# Patient Record
Sex: Female | Born: 1955 | Race: White | Hispanic: No | Marital: Married | Smoking: Current every day smoker
Health system: Southern US, Community
[De-identification: ages and names within clinical notes are randomized; demographics above are authoritative.]

## PROBLEM LIST (undated history)

## (undated) DIAGNOSIS — R011 Cardiac murmur, unspecified: Secondary | ICD-10-CM

## (undated) DIAGNOSIS — M1990 Unspecified osteoarthritis, unspecified site: Secondary | ICD-10-CM

## (undated) HISTORY — DX: Cardiac murmur, unspecified: R01.1

## (undated) HISTORY — DX: Unspecified osteoarthritis, unspecified site: M19.90

## (undated) HISTORY — PX: TUBAL LIGATION: SHX77

## (undated) HISTORY — PX: FRACTURE SURGERY: SHX138

---

## 2008-06-02 ENCOUNTER — Ambulatory Visit: Payer: Self-pay | Admitting: Urology

## 2010-03-15 ENCOUNTER — Emergency Department: Payer: Self-pay | Admitting: Internal Medicine

## 2011-09-09 DIAGNOSIS — J309 Allergic rhinitis, unspecified: Secondary | ICD-10-CM | POA: Insufficient documentation

## 2012-08-09 ENCOUNTER — Emergency Department: Payer: Self-pay | Admitting: Emergency Medicine

## 2014-05-25 DIAGNOSIS — E785 Hyperlipidemia, unspecified: Secondary | ICD-10-CM | POA: Insufficient documentation

## 2014-08-04 ENCOUNTER — Ambulatory Visit: Payer: Self-pay | Admitting: Family Medicine

## 2014-08-23 ENCOUNTER — Ambulatory Visit: Payer: Self-pay | Admitting: Family Medicine

## 2015-01-27 IMAGING — US US BREAST*R* LIMITED INC AXILLA
1 series · 4 of 4 positions shown · non-contrast
Comparison: Baseline evaluation 08/04/2014

CLINICAL DATA: Patient recalled from screening for right breast
asymmetry.

EXAM:
DIGITAL DIAGNOSTIC RIGHT MAMMOGRAM WITH 3D TOMOSYNTHESIS WITH CAD
ULTRASOUND RIGHT BREAST

[Series 1: us breast*right* limited inc axilla · 0.08mm/px · 4 of 4 slices shown]
[im 1/4]
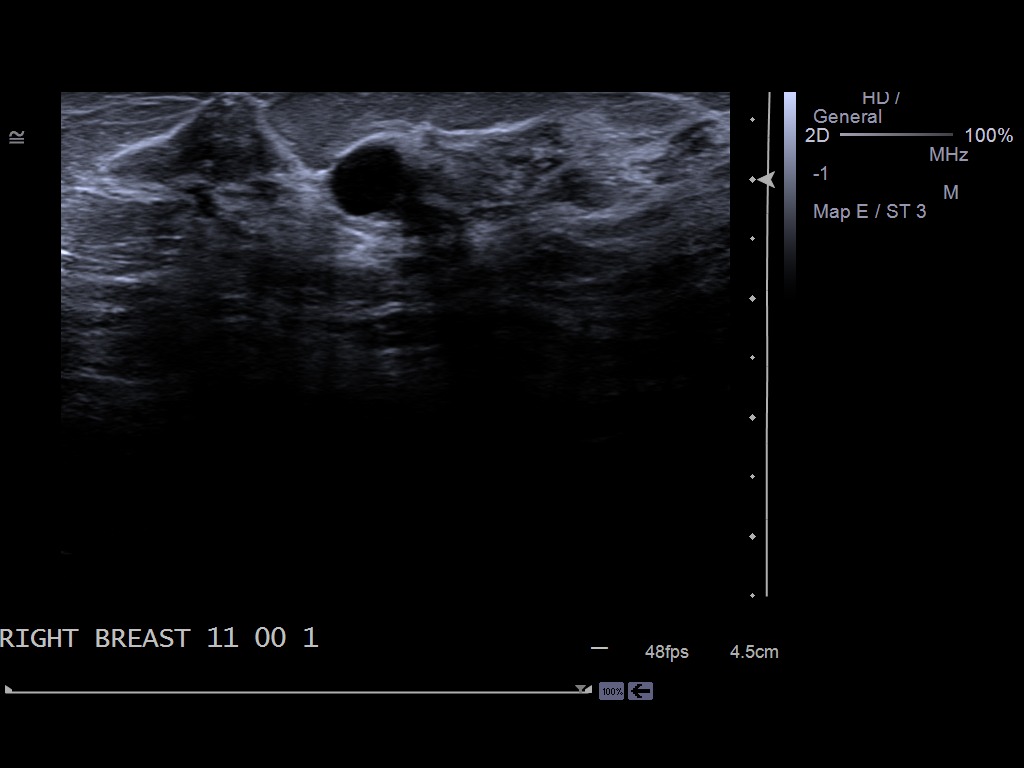
[im 2/4]
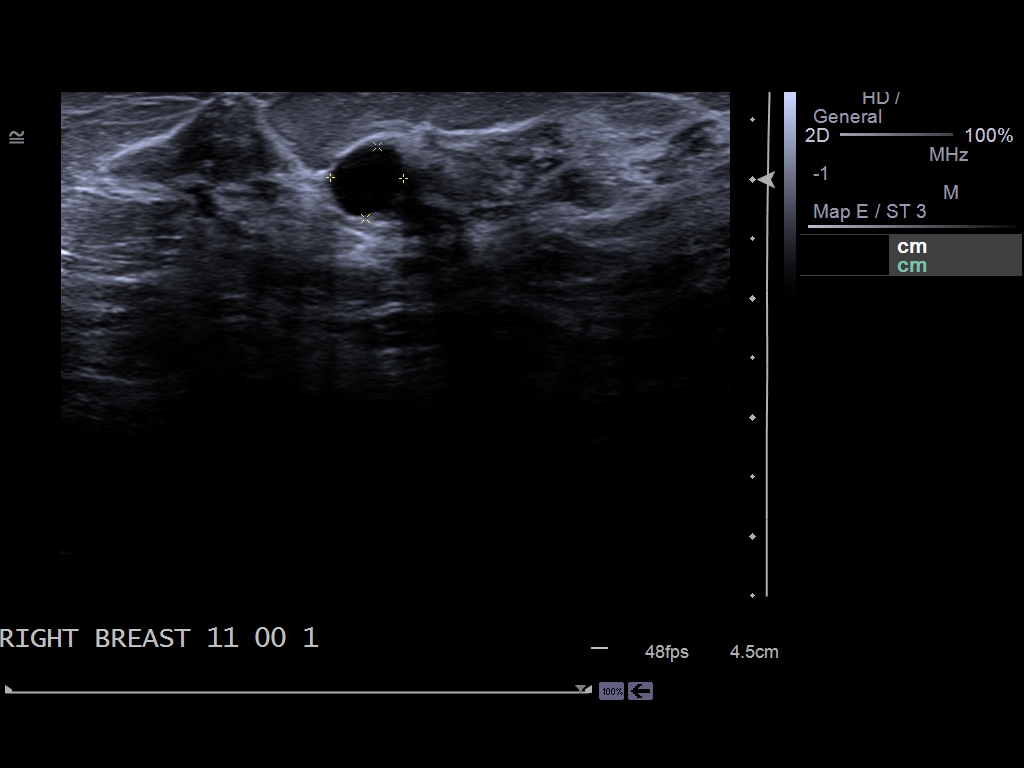
[im 3/4]
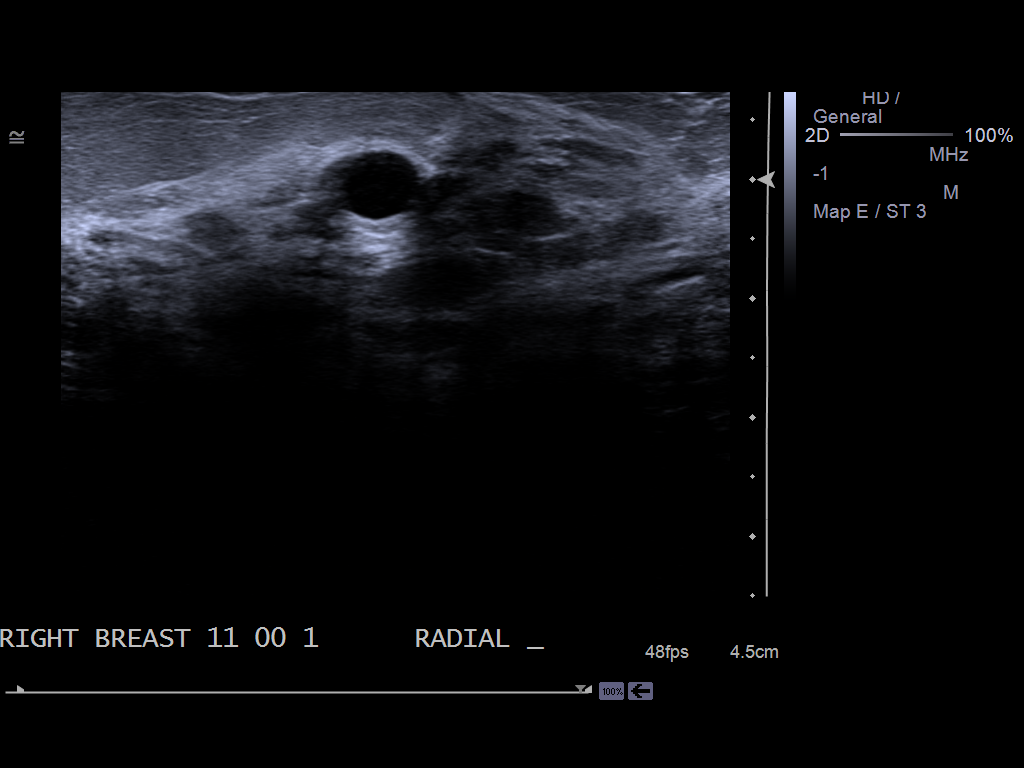
[im 4/4]
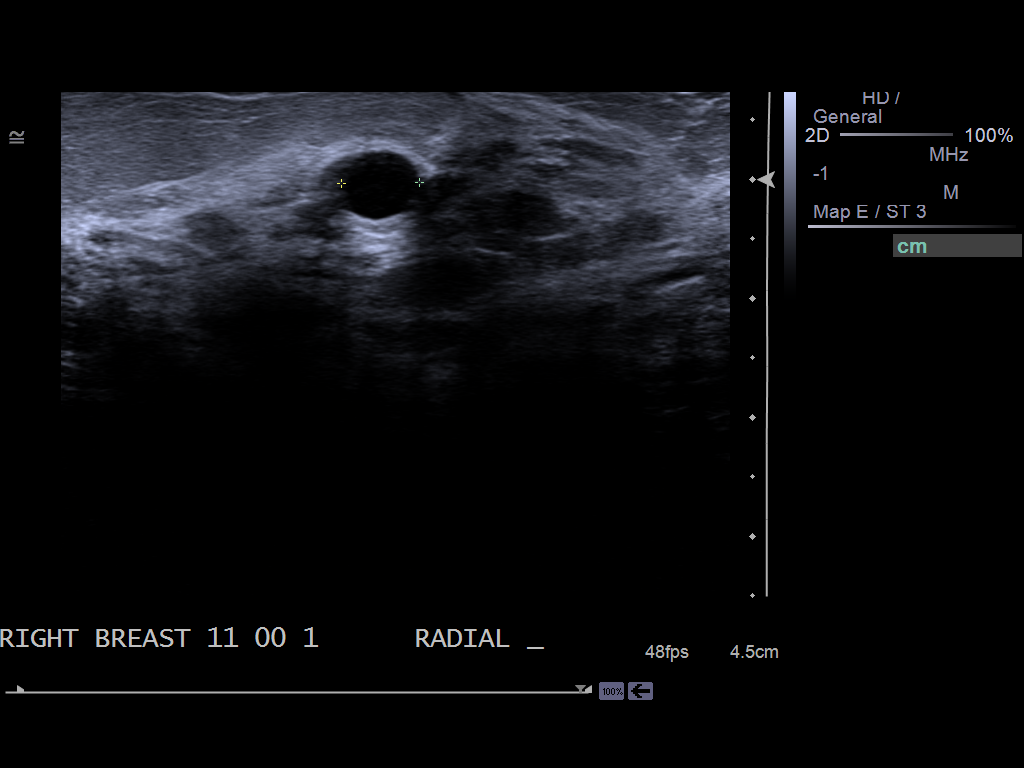

[4 of 4 positions shown; findings below may reference images not displayed]

ACR Breast Density Category c: The breast tissue is heterogeneously
dense, which may obscure small masses.
FINDINGS: Within the superior aspect of the right breast anteriorly on the MLO
view questioned asymmetry predominately effaced however there is
residual density in that location.

Mammographic images were processed with CAD.

On physical exam, I palpate no discrete mass within the superior
aspect of the right breast.

Ultrasound is performed, showing a 6 x 6 x 7 mm simple cyst within
the right breast 11 o'clock position 1 cm from the nipple.
IMPRESSION: Right breast simple cyst.

RECOMMENDATION:
Screening mammogram in one year.(Code:[id number])

I have discussed the findings and recommendations with the patient.
Results were also provided in writing at the conclusion of the
visit. If applicable, a reminder letter will be sent to the patient
regarding the next appointment.

BI-RADS CATEGORY  2: Benign.

## 2022-11-24 DIAGNOSIS — F419 Anxiety disorder, unspecified: Secondary | ICD-10-CM | POA: Insufficient documentation

## 2022-11-24 DIAGNOSIS — I10 Essential (primary) hypertension: Secondary | ICD-10-CM | POA: Insufficient documentation

## 2022-11-24 DIAGNOSIS — F32A Depression, unspecified: Secondary | ICD-10-CM | POA: Insufficient documentation

## 2022-11-24 DIAGNOSIS — R011 Cardiac murmur, unspecified: Secondary | ICD-10-CM | POA: Insufficient documentation

## 2022-11-24 NOTE — Patient Instructions (Signed)
Managing Anxiety, Adult After being diagnosed with anxiety, you may be relieved to know why you have felt or behaved a certain way. You may also feel overwhelmed about the treatment ahead and what it will mean for your life. With care and support, you can manage your anxiety. How to manage lifestyle changes Understanding the difference between stress and anxiety Although stress can play a role in anxiety, it is not the same as anxiety. Stress is your body's reaction to life changes and events, both good and bad. Stress is often caused by something external, such as a deadline, test, or competition. It normally goes away after the event has ended and will last just a few hours. But, stress can be ongoing and can lead to more than just stress. Anxiety is caused by something internal, such as imagining a terrible outcome or worrying that something will go wrong that will greatly upset you. Anxiety often does not go away even after the event is over, and it can become a long-term (chronic) worry. Lowering stress and anxiety Talk with your health care provider or a counselor to learn more about lowering anxiety and stress. They may suggest tension-reduction techniques, such as: Music. Spend time creating or listening to music that you enjoy and that inspires you. Mindfulness-based meditation. Practice being aware of your normal breaths while not trying to control your breathing. It can be done while sitting or walking. Centering prayer. Focus on a word, phrase, or sacred image that means something to you and brings you peace. Deep breathing. Expand your stomach and inhale slowly through your nose. Hold your breath for 3-5 seconds. Then breathe out slowly, letting your stomach muscles relax. Self-talk. Learn to notice and spot thought patterns that lead to anxiety reactions. Change those patterns to thoughts that feel peaceful. Muscle relaxation. Take time to tense muscles and then relax them. Choose a  tension-reduction technique that fits your lifestyle and personality. These techniques take time and practice. Set aside 5-15 minutes a day to do them. Specialized therapists can offer counseling and training in these techniques. The training to help with anxiety may be covered by some insurance plans. Other things you can do to manage stress and anxiety include: Keeping a stress diary. This can help you learn what triggers your reaction and then learn ways to manage your response. Thinking about how you react to certain situations. You may not be able to control everything, but you can control your response. Making time for activities that help you relax and not feeling guilty about spending your time in this way. Doing visual imagery. This involves imagining or creating mental pictures to help you relax. Practicing yoga. Through yoga poses, you can lower tension and relax.  Medicines Medicines for anxiety include: Antidepressant medicines. These are usually prescribed for long-term daily control. Anti-anxiety medicines. These may be added in severe cases, especially when panic attacks occur. When used together, medicines, psychotherapy, and tension-reduction techniques may be the most effective treatment. Relationships Relationships can play a big part in helping you recover. Spend more time connecting with trusted friends and family members. Think about going to couples counseling if you have a partner, taking family education classes, or going to family therapy. Therapy can help you and others better understand your anxiety. How to recognize changes in your anxiety Everyone responds differently to treatment for anxiety. Recovery from anxiety happens when symptoms lessen and stop interfering with your daily life at home or work. This may mean that you   will start to: Have better concentration and focus. Worry will interfere less in your daily thinking. Sleep better. Be less irritable. Have more  energy. Have improved memory. Try to recognize when your condition is getting worse. Contact your provider if your symptoms interfere with home or work and you feel like your condition is not improving. Follow these instructions at home: Activity Exercise. Adults should: Exercise for at least 150 minutes each week. The exercise should increase your heart rate and make you sweat (moderate-intensity exercise). Do strengthening exercises at least twice a week. Get the right amount and quality of sleep. Most adults need 7-9 hours of sleep each night. Lifestyle  Eat a healthy diet that includes plenty of vegetables, fruits, whole grains, low-fat dairy products, and lean protein. Do not eat a lot of foods that are high in fats, added sugars, or salt (sodium). Make choices that simplify your life. Do not use any products that contain nicotine or tobacco. These products include cigarettes, chewing tobacco, and vaping devices, such as e-cigarettes. If you need help quitting, ask your provider. Avoid caffeine, alcohol, and certain over-the-counter cold medicines. These may make you feel worse. Ask your pharmacist which medicines to avoid. General instructions Take over-the-counter and prescription medicines only as told by your provider. Keep all follow-up visits. This is to make sure you are managing your anxiety well or if you need more support. Where to find support You can get help and support from: Self-help groups. Online and community organizations. A trusted spiritual leader. Couples counseling. Family education classes. Family therapy. Where to find more information You may find that joining a support group helps you deal with your anxiety. The following sources can help you find counselors or support groups near you: Mental Health America: mentalhealthamerica.net Anxiety and Depression Association of America (ADAA): adaa.org National Alliance on Mental Illness (NAMI): nami.org Contact  a health care provider if: You have a hard time staying focused or finishing tasks. You spend many hours a day feeling worried about everyday life. You are very tired because you cannot stop worrying. You start to have headaches or often feel tense. You have chronic nausea or diarrhea. Get help right away if: Your heart feels like it is racing. You have shortness of breath. You have thoughts of hurting yourself or others. Get help right away if you feel like you may hurt yourself or others, or have thoughts about taking your own life. Go to your nearest emergency room or: Call 911. Call the National Suicide Prevention Lifeline at 1-800-273-8255 or 988. This is open 24 hours a day. Text the Crisis Text Line at 741741. This information is not intended to replace advice given to you by your health care provider. Make sure you discuss any questions you have with your health care provider. Document Revised: 04/23/2022 Document Reviewed: 11/05/2020 Elsevier Patient Education  2023 Elsevier Inc.  

## 2022-11-28 ENCOUNTER — Encounter: Payer: Self-pay | Admitting: Nurse Practitioner

## 2022-11-28 ENCOUNTER — Ambulatory Visit (INDEPENDENT_AMBULATORY_CARE_PROVIDER_SITE_OTHER): Payer: Medicare PPO | Admitting: Nurse Practitioner

## 2022-11-28 VITALS — BP 130/72 | HR 61 | Temp 98.1°F | Ht 63.86 in | Wt 131.4 lb

## 2022-11-28 DIAGNOSIS — I10 Essential (primary) hypertension: Secondary | ICD-10-CM

## 2022-11-28 DIAGNOSIS — F17210 Nicotine dependence, cigarettes, uncomplicated: Secondary | ICD-10-CM

## 2022-11-28 DIAGNOSIS — E785 Hyperlipidemia, unspecified: Secondary | ICD-10-CM

## 2022-11-28 DIAGNOSIS — F419 Anxiety disorder, unspecified: Secondary | ICD-10-CM | POA: Diagnosis not present

## 2022-11-28 DIAGNOSIS — R011 Cardiac murmur, unspecified: Secondary | ICD-10-CM

## 2022-11-28 DIAGNOSIS — B079 Viral wart, unspecified: Secondary | ICD-10-CM

## 2022-11-28 DIAGNOSIS — Z7689 Persons encountering health services in other specified circumstances: Secondary | ICD-10-CM

## 2022-11-28 DIAGNOSIS — F324 Major depressive disorder, single episode, in partial remission: Secondary | ICD-10-CM | POA: Diagnosis not present

## 2022-11-28 DIAGNOSIS — M1990 Unspecified osteoarthritis, unspecified site: Secondary | ICD-10-CM

## 2022-11-28 MED ORDER — SERTRALINE HCL 100 MG PO TABS: 100.0000 mg | ORAL_TABLET | Freq: Every day | ORAL | 4 refills | Status: DC

## 2022-11-28 MED ORDER — TRAZODONE HCL 50 MG PO TABS: 100.0000 mg | ORAL_TABLET | Freq: Every evening | ORAL | 4 refills | Status: DC | PRN

## 2022-11-28 NOTE — Assessment & Plan Note (Addendum)
Chronic, stable at this time with no medications.  Continue diet and exercise focus + maintaining weight loss.  Recommend she monitor BP at least a few mornings a week at home and document.  DASH diet at home.  Labs today: obtain next visit at physical.  Recommend cut back on smoking.  Return in 4 weeks.

## 2022-11-28 NOTE — Assessment & Plan Note (Signed)
Systolic 2/6 noted left upper chest, no symptoms.  Consider imaging if worsening or symptoms present.

## 2022-11-28 NOTE — Assessment & Plan Note (Signed)
Per past notes, plan on recheck lipid panel at physical in 4 weeks and determine if need for statin therapy.  At this time continue focus on exercise and diet + cut back on smoking.

## 2022-11-28 NOTE — Assessment & Plan Note (Signed)
To hands and lower back.  Recommend use of Tylenol 1000 MG every morning and Voltaren gel as needed.  If worsening consider imaging and possible physical therapy.  Currently she remains very active at baseline.

## 2022-11-28 NOTE — Progress Notes (Signed)
New Patient Office Visit  Subjective    Patient ID: Debra Munoz, female    DOB: 23-Jan-1956  Age: 67 y.o. MRN: [id number]  CC:  Chief Complaint  Patient presents with   New Patient (Initial Visit)    Just to establish care    HPI [facility] presents for new patient visit to establish care.  Introduced to Publishing rights manager role and practice setting.  All questions answered.  Discussed provider/patient relationship and expectations. Previously followed by [facility] with [facility].  Worked at [employer] as a Diplomatic Services operational officer.   Does have some arthritis to hands and lower back, takes occasional Tylenol.  Small wart to palmar aspect of left hand would like to see dermatology.  HYPERTENSION  In the past was on blood pressure medication -- but this stopped one year ago after breaking both ankles -- went to get out of chair and leg had fallen asleep, she stood up and fell.  She lost weight during that time, was almost 160 lbs to start, lost about 30 pounds.  Is a current smoker, smokes 1 pack or less a day.  Has been smoking for 50 years or more.  Did quit one time, took Chantix and quit for 3 years.   Duration of hypertension: chronic BP monitoring frequency:  not checking BP range:  Aspirin: no Recurrent headaches: no Visual changes: no Palpitations: sometimes will have speeding up sensation, but not often Dyspnea: no Chest pain: no Lower extremity edema: no Dizzy/lightheaded: no   DEPRESSION Taking Zoloft and Trazodone daily.  Dealing with some grieving as her friend, old room mate, is under hospice care for breast cancer. Started taking Zoloft when Covid started. Mood status: stable Satisfied with current treatment?: yes Symptom severity: mild  Duration of current treatment : chronic Side effects: no Medication compliance: good compliance Psychotherapy/counseling: none Previous psychiatric medications: Paxil Depressed mood: occasional Anxious mood:  occasional Anhedonia: no Significant weight loss or gain: no Insomnia: occasionally -- wakes up, falls asleep well Fatigue: no Feelings of worthlessness or guilt: no Impaired concentration/indecisiveness: no Suicidal ideations: no Hopelessness: no Crying spells:  occasional with grieving    11/28/2022    8:58 AM  Depression screen PHQ 2/9  Decreased Interest 0  Down, Depressed, Hopeless 1  PHQ - 2 Score 1  Altered sleeping 1  Tired, decreased energy 0  Change in appetite 0  Feeling bad or failure about yourself  0  Trouble concentrating 0  Moving slowly or fidgety/restless 0  Suicidal thoughts 0  PHQ-9 Score 2  Difficult doing work/chores Not difficult at all       11/28/2022    8:59 AM  GAD 7 : Generalized Anxiety Score  Nervous, Anxious, on Edge 0  Control/stop worrying 0  Worry too much - different things 0  Trouble relaxing 0  Restless 0  Easily annoyed or irritable 0  Afraid - awful might happen 0  Total GAD 7 Score 0  Anxiety Difficulty Not difficult at all     Outpatient Encounter Medications as of 11/28/2022  Medication Sig   [DISCONTINUED] PARoxetine (PAXIL) 40 MG tablet Take 40 mg by mouth every morning.   [DISCONTINUED] sertraline (ZOLOFT) 100 MG tablet Take 100 mg by mouth daily.   [DISCONTINUED] traZODone (DESYREL) 50 MG tablet Take 100 mg by mouth at bedtime as needed.   sertraline (ZOLOFT) 100 MG tablet Take 1 tablet (100 mg total) by mouth daily.   traZODone (DESYREL) 50 MG tablet Take 2 tablets (  100 mg total) by mouth at bedtime as needed.   No facility-administered encounter medications on file as of 11/28/2022.    Past Medical History:  Diagnosis Date   Arthritis    Heart murmur     Past Surgical History:  Procedure Laterality Date   FRACTURE SURGERY     TUBAL LIGATION      Family History  Problem Relation Age of Onset   Heart disease Mother    Emphysema Mother    Diabetes Mother    Diabetes Sister    Diabetes Sister    Aneurysm  Maternal Grandfather        brain   Heart disease Paternal Grandfather     Social History   Socioeconomic History   Marital status: Married    Spouse name: Not on file   Number of children: 2   Years of education: Not on file   Highest education level: Not on file  Occupational History   Occupation: retired  Tobacco Use   Smoking status: Every Day    Packs/day: 1.00    Years: 50.00    Additional pack years: 0.00    Total pack years: 50.00    Types: Cigarettes    Passive exposure: Past   Smokeless tobacco: Never  Vaping Use   Vaping Use: Never used  Substance and Sexual Activity   Alcohol use: Not Currently   Drug use: Never   Sexual activity: Not Currently  Other Topics Concern   Not on file  Social History Narrative   Not on file   Social Determinants of Health   Financial Resource Strain: Low Risk  (11/28/2022)   Overall Financial Resource Strain (CARDIA)    Difficulty of Paying Living Expenses: Not hard at all  Food Insecurity: No Food Insecurity (11/28/2022)   Hunger Vital Sign    Worried About Running Out of Food in the Last Year: Never true    Ran Out of Food in the Last Year: Never true  Transportation Needs: No Transportation Needs (11/28/2022)   PRAPARE - Administrator, Civil Service (Medical): No    Lack of Transportation (Non-Medical): No  Physical Activity: Sufficiently Active (11/28/2022)   Exercise Vital Sign    Days of Exercise per Week: 5 days    Minutes of Exercise per Session: 30 min  Stress: No Stress Concern Present (11/28/2022)   [employer] of Occupational Health - Occupational Stress Questionnaire    Feeling of Stress : Not at all  Social Connections: Moderately Integrated (11/28/2022)   Social Connection and Isolation Panel [NHANES]    Frequency of Communication with Friends and Family: More than three times a week    Frequency of Social Gatherings with Friends and Family: More than three times a week    Attends Religious  Services: 1 to 4 times per year    Active Member of [employer] or Organizations: No    Attends Banker Meetings: Never    Marital Status: Married  Catering manager Violence: Not At Risk (11/28/2022)   Humiliation, Afraid, Rape, and Kick questionnaire    Fear of Current or Ex-Partner: No    Emotionally Abused: No    Physically Abused: No    Sexually Abused: No    Review of Systems  Constitutional:  Negative for chills, diaphoresis, fever and weight loss.  Respiratory:  Negative for cough, shortness of breath and wheezing.   Cardiovascular:  Negative for chest pain, palpitations, orthopnea and leg swelling.  Neurological: Negative.  Endo/Heme/Allergies: Negative.   Psychiatric/Behavioral:  Positive for depression. Negative for suicidal ideas. The patient is nervous/anxious and has insomnia.       Objective    BP 130/72 (BP Location: Left Arm, Patient Position: Sitting)   Pulse 61   Temp 98.1 F (36.7 C) (Oral)   Ht 5' 3.86" (1.622 m)   Wt 131 lb 6.4 oz (59.6 kg)   SpO2 94%   BMI 22.66 kg/m   Physical Exam Vitals and nursing note reviewed.  Constitutional:      General: She is awake. She is not in acute distress.    Appearance: She is well-developed and well-groomed. She is not ill-appearing or toxic-appearing.  HENT:     Head: Normocephalic.     Right Ear: Hearing and external ear normal.     Left Ear: Hearing and external ear normal.  Eyes:     General: Lids are normal.        Right eye: No discharge.        Left eye: No discharge.     Conjunctiva/sclera: Conjunctivae normal.     Pupils: Pupils are equal, round, and reactive to light.  Neck:     Thyroid: No thyromegaly.     Vascular: No carotid bruit.  Cardiovascular:     Rate and Rhythm: Normal rate and regular rhythm.     Pulses:          Dorsalis pedis pulses are 1+ on the right side and 1+ on the left side.       Posterior tibial pulses are 1+ on the right side and 1+ on the left side.     Heart  sounds: Murmur heard.     Systolic murmur is present with a grade of 2/6.     No gallop.  Pulmonary:     Effort: Pulmonary effort is normal. No accessory muscle usage or respiratory distress.     Breath sounds: Normal breath sounds.  Abdominal:     General: Bowel sounds are normal.     Palpations: Abdomen is soft. There is no hepatomegaly or splenomegaly.  Musculoskeletal:     Cervical back: Normal range of motion and neck supple.     Right lower leg: No edema.     Left lower leg: No edema.     Right foot: Normal range of motion.     Left foot: Normal range of motion.  Feet:     Right foot:     Protective Sensation: 10 sites tested.  10 sites sensed.     Left foot:     Protective Sensation: 10 sites tested.  10 sites sensed.     Comments: Some varicosities/spider veins noted to bilateral lower legs. Lymphadenopathy:     Head:     Right side of head: No submental, submandibular, tonsillar, preauricular or posterior auricular adenopathy.     Left side of head: No submental, submandibular, tonsillar, preauricular or posterior auricular adenopathy.     Cervical: No cervical adenopathy.  Skin:    General: Skin is warm and dry.       Neurological:     Mental Status: She is alert and oriented to person, place, and time.     Deep Tendon Reflexes: Reflexes are normal and symmetric.     Reflex Scores:      Brachioradialis reflexes are 2+ on the right side and 2+ on the left side.      Patellar reflexes are 2+ on the right side and 2+  on the left side. Psychiatric:        Attention and Perception: Attention normal.        Mood and Affect: Mood normal.        Speech: Speech normal.        Behavior: Behavior normal. Behavior is cooperative.        Thought Content: Thought content normal.       Assessment & Plan:   Problem List Items Addressed This Visit       Cardiovascular and Mediastinum   Hypertension    Chronic, stable at this time with no medications.  Continue diet and  exercise focus + maintaining weight loss.  Recommend she monitor BP at least a few mornings a week at home and document.  DASH diet at home.  Labs today: obtain next visit at physical.  Recommend cut back on smoking.  Return in 4 weeks.         Musculoskeletal and Integument   Arthritis    To hands and lower back.  Recommend use of Tylenol 1000 MG every morning and Voltaren gel as needed.  If worsening consider imaging and possible physical therapy.  Currently she remains very active at baseline.      Wart of hand    Will place referral to dermatology for full skin exam per request.      Relevant Orders   Ambulatory referral to Dermatology     Other   Anxiety    Refer to depression plan of care.      Relevant Medications   sertraline (ZOLOFT) 100 MG tablet   traZODone (DESYREL) 50 MG tablet   Cigarette nicotine dependence without complication    I have recommended complete cessation of tobacco use. I have discussed various options available for assistance with tobacco cessation including over the counter methods (Nicotine gum, patch and lozenges). We also discussed prescription options (Chantix, Nicotine Inhaler / Nasal Spray). The patient is not interested in pursuing any prescription tobacco cessation options at this time. Referral for lung cancer screening.      Relevant Orders   Ambulatory Referral Lung Cancer Screening [facility]   Depression - Primary    Chronic, stable.  Denies SI/HI.  Continue Zoloft and Trazodone, which offer benefit to overall mood.  Refills sent in.  Consider therapy is worsening mood.        Relevant Medications   sertraline (ZOLOFT) 100 MG tablet   traZODone (DESYREL) 50 MG tablet   Dyslipidemia    Per past notes, plan on recheck lipid panel at physical in 4 weeks and determine if need for statin therapy.  At this time continue focus on exercise and diet + cut back on smoking.      Heart murmur    Systolic 2/6 noted left upper chest, no  symptoms.  Consider imaging if worsening or symptoms present.      Other Visit Diagnoses     Encounter to establish care       Introduced to clinic and provider, will attempt to get old records from [facility], release of records performed.       Return in about 4 weeks (around 12/26/2022) for Annual physical.   Marjie Skiff, NP

## 2022-11-28 NOTE — Assessment & Plan Note (Signed)
Refer to depression plan of care. 

## 2022-11-28 NOTE — Assessment & Plan Note (Signed)
Chronic, stable.  Denies SI/HI.  Continue Zoloft and Trazodone, which offer benefit to overall mood.  Refills sent in.  Consider therapy is worsening mood.

## 2022-11-28 NOTE — Assessment & Plan Note (Addendum)
I have recommended complete cessation of tobacco use. I have discussed various options available for assistance with tobacco cessation including over the counter methods (Nicotine gum, patch and lozenges). We also discussed prescription options (Chantix, Nicotine Inhaler / Nasal Spray). The patient is not interested in pursuing any prescription tobacco cessation options at this time.  Referral for lung cancer screening. 

## 2022-11-28 NOTE — Assessment & Plan Note (Signed)
Will place referral to dermatology for full skin exam per request.

## 2023-01-12 NOTE — Patient Instructions (Signed)
Please call to schedule your mammogram and/or bone density: [facility]  Address: [address], [place], [place] [address] Phone: [phone]  [facility] [address] [place],  [place]  [address] Phone: [phone]   Healthy Eating, Adult Healthy eating may help you get and keep a healthy body weight, reduce the risk of chronic disease, and live a long and productive life. It is important to follow a healthy eating pattern. Your nutritional and calorie needs should be met mainly by different nutrient-rich foods. What are tips for following this plan? Reading food labels Read labels and choose the following: Reduced or low sodium products. Juices with 100% fruit juice. Foods with low saturated fats (<3 g per serving) and high polyunsaturated and monounsaturated fats. Foods with whole grains, such as whole wheat, cracked wheat, brown rice, and wild rice. Whole grains that are fortified with folic acid. This is recommended for females who are pregnant or who want to become pregnant. Read labels and do not eat or drink the following: Foods or drinks with added sugars. These include foods that contain brown sugar, corn sweetener, corn syrup, dextrose, fructose, glucose, high-fructose corn syrup, honey, invert sugar, lactose, malt syrup, maltose, molasses, raw sugar, sucrose, trehalose, or turbinado sugar. Limit your intake of added sugars to less than 10% of your total daily calories. Do not eat more than the following amounts of added sugar per day: 6 teaspoons (25 g) for females. 9 teaspoons (38 g) for males. Foods that contain processed or refined starches and grains. Refined grain products, such as white flour, degermed cornmeal, white bread, and white rice. Shopping Choose nutrient-rich snacks, such as vegetables, whole fruits, and nuts. Avoid high-calorie and high-sugar snacks, such as potato chips,  fruit snacks, and candy. Use oil-based dressings and spreads on foods instead of solid fats such as butter, margarine, sour cream, or cream cheese. Limit pre-made sauces, mixes, and "instant" products such as flavored rice, instant noodles, and ready-made pasta. Try more plant-protein sources, such as tofu, tempeh, black beans, edamame, lentils, nuts, and seeds. Explore eating plans such as the Mediterranean diet or vegetarian diet. Try heart-healthy dips made with beans and healthy fats like hummus and guacamole. Vegetables go great with these. Cooking Use oil to saut or stir-fry foods instead of solid fats such as butter, margarine, or lard. Try baking, boiling, grilling, or broiling instead of frying. Remove the fatty part of meats before cooking. Steam vegetables in water or broth. Meal planning  At meals, imagine dividing your plate into fourths: One-half of your plate is fruits and vegetables. One-fourth of your plate is whole grains. One-fourth of your plate is protein, especially lean meats, poultry, eggs, tofu, beans, or nuts. Include low-fat dairy as part of your daily diet. Lifestyle Choose healthy options in all settings, including home, work, school, restaurants, or stores. Prepare your food safely: Wash your hands after handling raw meats. Where you prepare food, keep surfaces clean by regularly washing with hot, soapy water. Keep raw meats separate from ready-to-eat foods, such as fruits and vegetables. Cook seafood, meat, poultry, and eggs to the recommended temperature. Get a food thermometer. Store foods at safe temperatures. In general: Keep cold foods at 7F (4.4C) or below. Keep hot foods at 17F (60C) or above. Keep your freezer at [facility] (-17.8C) or below. Foods are not safe to eat if they have been between the temperatures of 40-17F (4.4-60C) for more than  2 hours. What foods should I eat? Fruits Aim to eat 1-2 cups of fresh, canned (in natural juice),  or frozen fruits each day. One cup of fruit equals 1 small apple, 1 large banana, 8 large strawberries, 1 cup (237 g) canned fruit,  cup (82 g) dried fruit, or 1 cup (240 mL) 100% juice. Vegetables Aim to eat 2-4 cups of fresh and frozen vegetables each day, including different varieties and colors. One cup of vegetables equals 1 cup (91 g) broccoli or cauliflower florets, 2 medium carrots, 2 cups (150 g) raw, leafy greens, 1 large tomato, 1 large bell pepper, 1 large sweet potato, or 1 medium white potato. Grains Aim to eat 5-10 ounce-equivalents of whole grains each day. Examples of 1 ounce-equivalent of grains include 1 slice of bread, 1 cup (40 g) ready-to-eat cereal, 3 cups (24 g) popcorn, or  cup (93 g) cooked rice. Meats and other proteins Try to eat 5-7 ounce-equivalents of protein each day. Examples of 1 ounce-equivalent of protein include 1 egg,  oz nuts (12 almonds, 24 pistachios, or 7 walnut halves), 1/4 cup (90 g) cooked beans, 6 tablespoons (90 g) hummus or 1 tablespoon (16 g) peanut butter. A cut of meat or fish that is the size of a deck of cards is about 3-4 ounce-equivalents (85 g). Of the protein you eat each week, try to have at least 8 sounce (227 g) of seafood. This is about 2 servings per week. This includes salmon, trout, herring, sardines, and anchovies. Dairy Aim to eat 3 cup-equivalents of fat-free or low-fat dairy each day. Examples of 1 cup-equivalent of dairy include 1 cup (240 mL) milk, 8 ounces (250 g) yogurt, 1 ounces (44 g) natural cheese, or 1 cup (240 mL) fortified soy milk. Fats and oils Aim for about 5 teaspoons (21 g) of fats and oils per day. Choose monounsaturated fats, such as canola and olive oils, mayonnaise made with olive oil or avocado oil, avocados, peanut butter, and most nuts, or polyunsaturated fats, such as sunflower, corn, and soybean oils, walnuts, pine nuts, sesame seeds, sunflower seeds, and flaxseed. Beverages Aim for 6 eight-ounce glasses of  water per day. Limit coffee to 3-5 eight-ounce cups per day. Limit caffeinated beverages that have added calories, such as soda and energy drinks. If you drink alcohol: Limit how much you have to: 0-1 drink a day if you are female. 0-2 drinks a day if you are female. Know how much alcohol is in your drink. In the [place] one drink is one 12 oz bottle of beer (355 mL), one 5 oz glass of wine (148 mL), or one 1 oz glass of hard liquor (44 mL). Seasoning and other foods Try not to add too much salt to your food. Try using herbs and spices instead of salt. Try not to add sugar to food. This information is based on [place] nutrition guidelines. To learn more, visit DisposableNylon.be. Exact amounts may vary. You may need different amounts. This information is not intended to replace advice given to you by your health care provider. Make sure you discuss any questions you have with your health care provider. Document Revised: 04/15/2022 Document Reviewed: 04/15/2022 Elsevier Patient Education  2024 [employer].

## 2023-01-16 ENCOUNTER — Ambulatory Visit (INDEPENDENT_AMBULATORY_CARE_PROVIDER_SITE_OTHER): Payer: Medicare PPO | Admitting: Nurse Practitioner

## 2023-01-16 ENCOUNTER — Encounter: Payer: Self-pay | Admitting: Nurse Practitioner

## 2023-01-16 VITALS — BP 148/86 | HR 62 | Temp 97.9°F | Ht 63.86 in | Wt 133.4 lb

## 2023-01-16 DIAGNOSIS — I10 Essential (primary) hypertension: Secondary | ICD-10-CM | POA: Diagnosis not present

## 2023-01-16 DIAGNOSIS — F17210 Nicotine dependence, cigarettes, uncomplicated: Secondary | ICD-10-CM

## 2023-01-16 DIAGNOSIS — F419 Anxiety disorder, unspecified: Secondary | ICD-10-CM | POA: Diagnosis not present

## 2023-01-16 DIAGNOSIS — F32A Depression, unspecified: Secondary | ICD-10-CM

## 2023-01-16 DIAGNOSIS — Z0000 Encounter for general adult medical examination without abnormal findings: Secondary | ICD-10-CM | POA: Diagnosis not present

## 2023-01-16 DIAGNOSIS — F324 Major depressive disorder, single episode, in partial remission: Secondary | ICD-10-CM

## 2023-01-16 DIAGNOSIS — E559 Vitamin D deficiency, unspecified: Secondary | ICD-10-CM

## 2023-01-16 DIAGNOSIS — R011 Cardiac murmur, unspecified: Secondary | ICD-10-CM

## 2023-01-16 DIAGNOSIS — E785 Hyperlipidemia, unspecified: Secondary | ICD-10-CM | POA: Diagnosis not present

## 2023-01-16 DIAGNOSIS — Z1231 Encounter for screening mammogram for malignant neoplasm of breast: Secondary | ICD-10-CM

## 2023-01-16 DIAGNOSIS — Z23 Encounter for immunization: Secondary | ICD-10-CM

## 2023-01-16 MED ORDER — VALSARTAN 40 MG PO TABS
40.0000 mg | ORAL_TABLET | Freq: Every day | ORAL | 3 refills | Status: DC
Start: 2023-01-16 — End: 2023-02-13

## 2023-01-16 NOTE — Assessment & Plan Note (Signed)
Chronic, stable.  Denies SI/HI.  Continue Zoloft and Trazodone, which offer benefit to overall mood.  Refills sent in.  Consider therapy is worsening mood.   

## 2023-01-16 NOTE — Assessment & Plan Note (Signed)
Systolic 2/6 noted left upper chest, no symptoms.  Consider imaging if worsening or symptoms present. 

## 2023-01-16 NOTE — Assessment & Plan Note (Signed)
Per past notes, plan on recheck lipid panel today and determine if need for statin therapy.  At this time continue focus on exercise and diet + cut back on smoking.

## 2023-01-16 NOTE — Assessment & Plan Note (Signed)
Chronic,ongoing.  BP elevated on checks today and elevated at home with some headaches.  Discussed at length with patient and joint decision to restart medication.  Will start with Valsartan 40 MG daily, educated her on the ARB family and side effects.  Continue diet and exercise focus + maintaining weight loss.  Recommend she monitor BP at least a few mornings a week at home and document.  DASH diet at home.  Labs today: CBC, CMP, TSH.  Recommend cut back on smoking.  Return in 4 weeks.

## 2023-01-16 NOTE — Assessment & Plan Note (Signed)
Refer to depression plan of care. 

## 2023-01-16 NOTE — Progress Notes (Addendum)
BP (!) 148/86 (BP Location: Left Arm, Patient Position: Sitting, Cuff Size: Normal)   Pulse 62   Temp 97.9 F (36.6 C) (Oral)   Ht 5' 3.86" (1.622 m)   Wt 133 lb 6.4 oz (60.5 kg)   SpO2 98%   BMI 23.00 kg/m    Subjective:    Patient ID: Debra Munoz, female    DOB: 29-Apr-1956, 67 y.o.   MRN: [id number]  HPI: Debra Munoz is a 67 y.o. female presenting on 01/16/2023 for Medicare Annual Wellness and physical. Current medical complaints include:none  She currently lives with: husband Menopausal Symptoms: no  HYPERTENSION  In past took blood pressure medication, has been off of this for <1 year.    Is a current smoker, smokes 1 pack or less a day.  Has been smoking for 50 years or more.  Did quit one time, took Chantix and quit for 3 years.   Duration of hypertension: chronic BP monitoring frequency: daily BP range: as in office, similar Aspirin: no Recurrent headaches: no Visual changes: no Palpitations: sometimes will have speeding up sensation, but not often Dyspnea: no Chest pain: no Lower extremity edema: no Dizzy/lightheaded: no   DEPRESSION Taking Zoloft and Trazodone daily.  Grieving as her friend, old room mate, is under hospice care for breast cancer.  Mood status: stable Satisfied with current treatment?: yes Symptom severity: mild  Duration of current treatment : chronic Side effects: no Medication compliance: good compliance Psychotherapy/counseling: none Previous psychiatric medications: Paxil Depressed mood: no Anxious mood: no Anhedonia: no Significant weight loss or gain: no Insomnia: occasionally -- wakes up, falls asleep well Fatigue: no Feelings of worthlessness or guilt: no Impaired concentration/indecisiveness: no Suicidal ideations: no Hopelessness: no Crying spells:  occasional with grieving    01/16/2023    8:16 AM 11/28/2022    8:58 AM  Depression screen PHQ 2/9  Decreased Interest 0 0  Down, Depressed, Hopeless  1 1  PHQ - 2 Score 1 1  Altered sleeping 2 1  Tired, decreased energy 0 0  Change in appetite 0 0  Feeling bad or failure about yourself  0 0  Trouble concentrating 0 0  Moving slowly or fidgety/restless 0 0  Suicidal thoughts 0 0  PHQ-9 Score 3 2  Difficult doing work/chores Not difficult at all Not difficult at all       01/16/2023    8:17 AM 11/28/2022    8:59 AM  GAD 7 : Generalized Anxiety Score  Nervous, Anxious, on Edge 0 0  Control/stop worrying 0 0  Worry too much - different things 0 0  Trouble relaxing 0 0  Restless 0 0  Easily annoyed or irritable 0 0  Afraid - awful might happen 0 0  Total GAD 7 Score 0 0  Anxiety Difficulty Not difficult at all Not difficult at all        11/28/2022    8:58 AM 01/16/2023    8:16 AM  Fall Risk  Falls in the past year? 0 1  Was there an injury with Fall? 0 0  Fall Risk Category Calculator 0 1  Patient at Risk for Falls Due to No Fall Risks History of fall(s)  Fall risk Follow up Falls evaluation completed Falls evaluation completed     Functional Status Survey: Is the patient deaf or have difficulty hearing?: No Does the patient have difficulty seeing, even when wearing glasses/contacts?: No Does the patient have difficulty concentrating, remembering, or making decisions?: No  Does the patient have difficulty walking or climbing stairs?: No Does the patient have difficulty dressing or bathing?: No Does the patient have difficulty doing errands alone such as visiting a doctor's office or shopping?: No    Past Medical History:  Past Medical History:  Diagnosis Date   Arthritis    Heart murmur     Surgical History:  Past Surgical History:  Procedure Laterality Date   FRACTURE SURGERY     TUBAL LIGATION      Medications:  Current Outpatient Medications on File Prior to Visit  Medication Sig   sertraline (ZOLOFT) 100 MG tablet Take 1 tablet (100 mg total) by mouth daily.   traZODone (DESYREL) 50 MG tablet Take 2  tablets (100 mg total) by mouth at bedtime as needed.   No current facility-administered medications on file prior to visit.    Allergies:  No Known Allergies  Social History:  Social History   Socioeconomic History   Marital status: Married    Spouse name: Not on file   Number of children: 2   Years of education: Not on file   Highest education level: Not on file  Occupational History   Occupation: retired  Tobacco Use   Smoking status: Every Day    Packs/day: 1.00    Years: 50.00    Additional pack years: 0.00    Total pack years: 50.00    Types: Cigarettes    Passive exposure: Past   Smokeless tobacco: Never  Vaping Use   Vaping Use: Never used  Substance and Sexual Activity   Alcohol use: Not Currently   Drug use: Never   Sexual activity: Not Currently  Other Topics Concern   Not on file  Social History Narrative   Not on file   Social Determinants of Health   Financial Resource Strain: Low Risk  (11/28/2022)   Overall Financial Resource Strain (CARDIA)    Difficulty of Paying Living Expenses: Not hard at all  Food Insecurity: No Food Insecurity (11/28/2022)   Hunger Vital Sign    Worried About Running Out of Food in the Last Year: Never true    Ran Out of Food in the Last Year: Never true  Transportation Needs: No Transportation Needs (11/28/2022)   PRAPARE - Administrator, Civil Service (Medical): No    Lack of Transportation (Non-Medical): No  Physical Activity: Sufficiently Active (11/28/2022)   Exercise Vital Sign    Days of Exercise per Week: 5 days    Minutes of Exercise per Session: 30 min  Stress: No Stress Concern Present (11/28/2022)   [employer] of Occupational Health - Occupational Stress Questionnaire    Feeling of Stress : Not at all  Social Connections: Moderately Integrated (11/28/2022)   Social Connection and Isolation Panel [NHANES]    Frequency of Communication with Friends and Family: More than three times a week     Frequency of Social Gatherings with Friends and Family: More than three times a week    Attends Religious Services: 1 to 4 times per year    Active Member of [employer] or Organizations: No    Attends Banker Meetings: Never    Marital Status: Married  Catering manager Violence: Not At Risk (11/28/2022)   Humiliation, Afraid, Rape, and Kick questionnaire    Fear of Current or Ex-Partner: No    Emotionally Abused: No    Physically Abused: No    Sexually Abused: No   Social History   Tobacco  Use  Smoking Status Every Day   Packs/day: 1.00   Years: 50.00   Additional pack years: 0.00   Total pack years: 50.00   Types: Cigarettes   Passive exposure: Past  Smokeless Tobacco Never   Social History   Substance and Sexual Activity  Alcohol Use Not Currently    Family History:  Family History  Problem Relation Age of Onset   Heart disease Mother    Emphysema Mother    Diabetes Mother    Diabetes Sister    Diabetes Sister    Aneurysm Maternal Grandfather        brain   Heart disease Paternal Grandfather     Past medical history, surgical history, medications, allergies, family history and social history reviewed with patient today and changes made to appropriate areas of the chart.   ROS All other ROS negative except what is listed above and in the HPI.      Objective:    BP (!) 148/86 (BP Location: Left Arm, Patient Position: Sitting, Cuff Size: Normal)   Pulse 62   Temp 97.9 F (36.6 C) (Oral)   Ht 5' 3.86" (1.622 m)   Wt 133 lb 6.4 oz (60.5 kg)   SpO2 98%   BMI 23.00 kg/m   Wt Readings from Last 3 Encounters:  01/16/23 133 lb 6.4 oz (60.5 kg)  11/28/22 131 lb 6.4 oz (59.6 kg)    Physical Exam Vitals and nursing note reviewed. Exam conducted with a chaperone present.  Constitutional:      General: She is awake. She is not in acute distress.    Appearance: She is well-developed and well-groomed. She is not ill-appearing or toxic-appearing.  HENT:      Head: Normocephalic and atraumatic.     Right Ear: Hearing, tympanic membrane, ear canal and external ear normal. No drainage.     Left Ear: Hearing, tympanic membrane, ear canal and external ear normal. No drainage.     Nose: Nose normal.     Right Sinus: No maxillary sinus tenderness or frontal sinus tenderness.     Left Sinus: No maxillary sinus tenderness or frontal sinus tenderness.     Mouth/Throat:     Mouth: Mucous membranes are moist.     Pharynx: Oropharynx is clear. Uvula midline. No pharyngeal swelling, oropharyngeal exudate or posterior oropharyngeal erythema.  Eyes:     General: Lids are normal.        Right eye: No discharge.        Left eye: No discharge.     Extraocular Movements: Extraocular movements intact.     Conjunctiva/sclera: Conjunctivae normal.     Pupils: Pupils are equal, round, and reactive to light.     Visual Fields: Right eye visual fields normal and left eye visual fields normal.  Neck:     Thyroid: No thyromegaly.     Vascular: No carotid bruit.     Trachea: Trachea normal.  Cardiovascular:     Rate and Rhythm: Normal rate and regular rhythm.     Heart sounds: Murmur heard.     Systolic murmur is present with a grade of 2/6.     No gallop.  Pulmonary:     Effort: Pulmonary effort is normal. No accessory muscle usage or respiratory distress.     Breath sounds: Normal breath sounds.  Chest:  Breasts:    Right: Normal.     Left: Normal.  Abdominal:     General: Bowel sounds are normal.  Palpations: Abdomen is soft. There is no hepatomegaly or splenomegaly.     Tenderness: There is no abdominal tenderness.  Musculoskeletal:        General: Normal range of motion.     Cervical back: Normal range of motion and neck supple.     Right lower leg: No edema.     Left lower leg: No edema.  Lymphadenopathy:     Head:     Right side of head: No submental, submandibular, tonsillar, preauricular or posterior auricular adenopathy.     Left side of  head: No submental, submandibular, tonsillar, preauricular or posterior auricular adenopathy.     Cervical: No cervical adenopathy.     Upper Body:     Right upper body: No supraclavicular, axillary or pectoral adenopathy.     Left upper body: No supraclavicular, axillary or pectoral adenopathy.  Skin:    General: Skin is warm and dry.     Capillary Refill: Capillary refill takes less than 2 seconds.     Findings: No rash.  Neurological:     Mental Status: She is alert and oriented to person, place, and time.     Gait: Gait is intact.     Deep Tendon Reflexes: Reflexes are normal and symmetric.     Reflex Scores:      Brachioradialis reflexes are 2+ on the right side and 2+ on the left side.      Patellar reflexes are 2+ on the right side and 2+ on the left side. Psychiatric:        Attention and Perception: Attention normal.        Mood and Affect: Mood normal.        Speech: Speech normal.        Behavior: Behavior normal. Behavior is cooperative.        Thought Content: Thought content normal.        Judgment: Judgment normal.       01/16/2023    8:25 AM  6CIT Screen  What Year? 0 points  What month? 0 points  What time? 0 points  Count back from 20 0 points  Months in reverse 0 points  Repeat phrase 0 points  Total Score 0 points   No results found for this or any previous visit.    Assessment & Plan:   Problem List Items Addressed This Visit       Cardiovascular and Mediastinum   Hypertension    Chronic,ongoing.  BP elevated on checks today and elevated at home with some headaches.  Discussed at length with patient and joint decision to restart medication.  Will start with Valsartan 40 MG daily, educated her on the ARB family and side effects.  Continue diet and exercise focus + maintaining weight loss.  Recommend she monitor BP at least a few mornings a week at home and document.  DASH diet at home.  Labs today: CBC, CMP, TSH.  Recommend cut back on smoking.  Return  in 4 weeks.       Relevant Medications   valsartan (DIOVAN) 40 MG tablet   Other Relevant Orders   CBC with Differential/Platelet   TSH     Other   Anxiety    Refer to depression plan of care.      Cigarette nicotine dependence without complication    I have recommended complete cessation of tobacco use. I have discussed various options available for assistance with tobacco cessation including over the counter methods (Nicotine gum, patch  and lozenges). We also discussed prescription options (Chantix, Nicotine Inhaler / Nasal Spray). The patient is not interested in pursuing any prescription tobacco cessation options at this time. Referral for lung cancer screening placed last visit and she is attempting to schedule.      Depression    Chronic, stable.  Denies SI/HI.  Continue Zoloft and Trazodone, which offer benefit to overall mood.  Refills sent in.  Consider therapy is worsening mood.        Dyslipidemia    Per past notes, plan on recheck lipid panel today and determine if need for statin therapy.  At this time continue focus on exercise and diet + cut back on smoking.      Relevant Orders   Comprehensive metabolic panel   Lipid Panel w/o Chol/HDL Ratio   Heart murmur    Systolic 2/6 noted left upper chest, no symptoms.  Consider imaging if worsening or symptoms present.      Relevant Orders   Comprehensive metabolic panel   Lipid Panel w/o Chol/HDL Ratio   Other Visit Diagnoses     Medicare annual wellness visit, subsequent    -  Primary   Medicare wellness due and performed with patient today.   Vitamin D deficiency       History of low levels reported, check today and start supplement as needed.   Relevant Orders   VITAMIN D 25 Hydroxy (Vit-D Deficiency, Fractures)   Pneumococcal vaccination given       PCV20 provided in office today and educated patient.   Relevant Orders   Pneumococcal conjugate vaccine 20-valent (Prevnar 20) (Completed)   Encounter for  screening mammogram for malignant neoplasm of breast       Mammogram ordered today and informed patient how to call and schedule.   Relevant Orders   MM 3D SCREENING MAMMOGRAM BILATERAL BREAST   Need for Td vaccine       Td vaccine provided in office today and discussed with patient.   Relevant Orders   Td vaccine greater than or equal to 7yo preservative free IM (Completed)   Encounter for annual physical exam       Annual physical today with labs and health maintenance reviewed, discussed with patient.        Follow up plan: Return in about 4 weeks (around 02/13/2023) for HTN -- started Valsartan 40 MG.   LABORATORY TESTING:  - Pap smear: not applicable  IMMUNIZATIONS:   - Tdap: Tetanus vaccination status reviewed: last tetanus booster within 10 years. - Influenza: Up to date - Pneumovax: Not Applicable - Prevnar: Up to date - COVID: Up to date - HPV: Not applicable - Shingrix vaccine: she had these one year ago  SCREENING: -Mammogram: Ordered today  - Colonoscopy: Refused  - Bone Density: Up to date  -Hearing Test: Not applicable  -Spirometry: Not applicable   PATIENT COUNSELING:   Advised to take 1 mg of folate supplement per day if capable of pregnancy.   Sexuality: Discussed sexually transmitted diseases, partner selection, use of condoms, avoidance of unintended pregnancy  and contraceptive alternatives.   Advised to avoid cigarette smoking.  I discussed with the patient that most people either abstain from alcohol or drink within safe limits (<=14/week and <=4 drinks/occasion for males, <=7/weeks and <= 3 drinks/occasion for females) and that the risk for alcohol disorders and other health effects rises proportionally with the number of drinks per week and how often a drinker exceeds daily limits.  Discussed cessation/primary  prevention of drug use and availability of treatment for abuse.   Diet: Encouraged to adjust caloric intake to maintain  or achieve ideal  body weight, to reduce intake of dietary saturated fat and total fat, to limit sodium intake by avoiding high sodium foods and not adding table salt, and to maintain adequate dietary potassium and calcium preferably from fresh fruits, vegetables, and low-fat dairy products.    Stressed the importance of regular exercise  Injury prevention: Discussed safety belts, safety helmets, smoke detector, smoking near bedding or upholstery.   Dental health: Discussed importance of regular tooth brushing, flossing, and dental visits.    NEXT PREVENTATIVE PHYSICAL DUE IN 1 YEAR. Return in about 4 weeks (around 02/13/2023) for HTN -- started Valsartan 40 MG.

## 2023-01-16 NOTE — Progress Notes (Signed)
Called to request update on records. Medical Records are being resent via fax.

## 2023-01-16 NOTE — Assessment & Plan Note (Signed)
I have recommended complete cessation of tobacco use. I have discussed various options available for assistance with tobacco cessation including over the counter methods (Nicotine gum, patch and lozenges). We also discussed prescription options (Chantix, Nicotine Inhaler / Nasal Spray). The patient is not interested in pursuing any prescription tobacco cessation options at this time. Referral for lung cancer screening placed last visit and she is attempting to schedule.

## 2023-01-17 ENCOUNTER — Other Ambulatory Visit: Payer: Self-pay | Admitting: Nurse Practitioner

## 2023-01-17 ENCOUNTER — Encounter: Payer: Self-pay | Admitting: Nurse Practitioner

## 2023-01-17 DIAGNOSIS — E559 Vitamin D deficiency, unspecified: Secondary | ICD-10-CM | POA: Insufficient documentation

## 2023-01-17 LAB — CBC WITH DIFFERENTIAL/PLATELET
Basophils Absolute: 0 10*3/uL (ref 0.0–0.2)
Basos: 1 %
EOS (ABSOLUTE): 0.1 10*3/uL (ref 0.0–0.4)
Eos: 2 %
Hematocrit: 40.9 % (ref 34.0–46.6)
Hemoglobin: 13.2 g/dL (ref 11.1–15.9)
Immature Grans (Abs): 0 10*3/uL (ref 0.0–0.1)
Immature Granulocytes: 0 %
Lymphocytes Absolute: 1 10*3/uL (ref 0.7–3.1)
Lymphs: 18 %
MCH: 28.9 pg (ref 26.6–33.0)
MCHC: 32.3 g/dL (ref 31.5–35.7)
MCV: 90 fL (ref 79–97)
Monocytes Absolute: 0.5 10*3/uL (ref 0.1–0.9)
Monocytes: 8 %
Neutrophils Absolute: 3.9 10*3/uL (ref 1.4–7.0)
Neutrophils: 71 %
Platelets: 245 10*3/uL (ref 150–450)
RBC: 4.57 x10E6/uL (ref 3.77–5.28)
RDW: 12.9 % (ref 11.7–15.4)
WBC: 5.6 10*3/uL (ref 3.4–10.8)

## 2023-01-17 LAB — COMPREHENSIVE METABOLIC PANEL
ALT: 9 IU/L (ref 0–32)
AST: 13 IU/L (ref 0–40)
Albumin: 4.2 g/dL (ref 3.9–4.9)
Alkaline Phosphatase: 90 IU/L (ref 44–121)
BUN/Creatinine Ratio: 15 (ref 12–28)
BUN: 12 mg/dL (ref 8–27)
Bilirubin Total: 0.3 mg/dL (ref 0.0–1.2)
CO2: 24 mmol/L (ref 20–29)
Calcium: 9.2 mg/dL (ref 8.7–10.3)
Chloride: 101 mmol/L (ref 96–106)
Creatinine, Ser: 0.82 mg/dL (ref 0.57–1.00)
Globulin, Total: 2 g/dL (ref 1.5–4.5)
Glucose: 82 mg/dL (ref 70–99)
Potassium: 4.1 mmol/L (ref 3.5–5.2)
Sodium: 138 mmol/L (ref 134–144)
Total Protein: 6.2 g/dL (ref 6.0–8.5)
eGFR: 77 mL/min/{1.73_m2} (ref >59)

## 2023-01-17 LAB — LIPID PANEL W/O CHOL/HDL RATIO
Cholesterol, Total: 183 mg/dL (ref 100–199)
HDL: 46 mg/dL (ref >39)
LDL Chol Calc (NIH): 115 mg/dL — ABNORMAL HIGH (ref 0–99)
Triglycerides: 125 mg/dL (ref 0–149)
VLDL Cholesterol Cal: 22 mg/dL (ref 5–40)

## 2023-01-17 LAB — TSH: TSH: 2.03 u[IU]/mL (ref 0.450–4.500)

## 2023-01-17 LAB — VITAMIN D 25 HYDROXY (VIT D DEFICIENCY, FRACTURES): Vit D, 25-Hydroxy: 27.1 ng/mL — ABNORMAL LOW (ref 30.0–100.0)

## 2023-01-17 MED ORDER — ROSUVASTATIN CALCIUM 10 MG PO TABS
10.0000 mg | ORAL_TABLET | Freq: Every day | ORAL | 3 refills | Status: DC
Start: 2023-01-17 — End: 2023-04-16

## 2023-01-17 NOTE — Progress Notes (Signed)
Contacted via MyChart The 10-year ASCVD risk score (Arnett DK, et al., 2019) is: 24.1%   Values used to calculate the score:     Age: 67 years     Sex: Female     Is Non-Hispanic African American: No     Diabetic: No     Tobacco smoker: Yes     Systolic Blood Pressure: 148 mmHg     Is BP treated: Yes     HDL Cholesterol: 46 mg/dL     Total Cholesterol: 183 mg/dL   Good morning Debra Munoz, your labs have returned: - CBC shows no anemia or infection - Kidney function, creatinine and eGFR, remains normal, as is liver function, AST and ALT.  - Thyroid level normal. - Vitamin D level a little low, I recommend starting to take Vitamin D3 2000 units daily for overall bone health.  You can obtain this over the counter. - Cholesterol levels showing LDL (bad cholesterol) elevated and when I calculate your ASCVD risk score (risk for cardiac event in next 10 years) this is elevated.  I am sending in Rosuvastatin for your to start to help lower levels.  We will start at lowest dose and recheck labs next visit.  Any questions? Keep being amazing!!  Thank you for allowing me to participate in your care.  I appreciate you. Kindest regards, Bienvenido Proehl

## 2023-01-21 ENCOUNTER — Ambulatory Visit: Payer: Medicare PPO

## 2023-01-28 ENCOUNTER — Other Ambulatory Visit: Payer: Self-pay | Admitting: Emergency Medicine

## 2023-01-28 DIAGNOSIS — Z87891 Personal history of nicotine dependence: Secondary | ICD-10-CM

## 2023-01-28 DIAGNOSIS — F17210 Nicotine dependence, cigarettes, uncomplicated: Secondary | ICD-10-CM

## 2023-01-28 DIAGNOSIS — Z122 Encounter for screening for malignant neoplasm of respiratory organs: Secondary | ICD-10-CM

## 2023-02-06 ENCOUNTER — Ambulatory Visit
Admission: RE | Admit: 2023-02-06 | Discharge: 2023-02-06 | Disposition: A | Discharge status: Home / Self Care | Payer: Medicare PPO | Source: Ambulatory Visit | Attending: Nurse Practitioner | Admitting: Nurse Practitioner

## 2023-02-06 ENCOUNTER — Other Ambulatory Visit: Payer: Self-pay

## 2023-02-06 ENCOUNTER — Ambulatory Visit: Payer: Self-pay

## 2023-02-06 VITALS — BP 148/64 | HR 61 | Temp 99.0°F | Resp 16

## 2023-02-06 DIAGNOSIS — M545 Low back pain, unspecified: Secondary | ICD-10-CM

## 2023-02-06 DIAGNOSIS — R1084 Generalized abdominal pain: Secondary | ICD-10-CM | POA: Diagnosis not present

## 2023-02-06 NOTE — ED Triage Notes (Signed)
Low back pain since Tuesday. Denies any trauma to the area, fever, dysuria. Believes her medication change a week ago to Valsartan has began to constipate her, and thinks that may be part of the cause. No previous hx of back issues. Does report the pain radiates into her stomach and a hx of UTIs in the past.

## 2023-02-06 NOTE — Telephone Encounter (Signed)
Chief Complaint: Back pain Symptoms: lower back pain that radiates to the abdomen 5/10 on pain scale Frequency: onset Monday comes and goes but today it is constant Pertinent Negatives: Patient denies injury, SOB, and urinary symptoms. Disposition: [] ED /[x] Urgent Care (no appt availability in office) / [] Appointment(In office/virtual)/ []  [facility]/ [] Home Care/ [] Refused Recommended Disposition /[] [facility]/ []  Follow-up with PCP Additional Notes: Patient reports lower back pain that radiates to the abdomen area. Pain 5/10 today. Patient reports drinking plenty of water and is not sure what is causing the discomfort. Advised patient that she will need to be evaluated. Patient is agreeable. No appointments available in office until Monday. Patient request to be seen before the weekend. Patient has been scheduled at [facility] today at 1345.   Reason for Disposition  [1] MODERATE back pain (e.g., interferes with normal activities) AND [2] present > 3 days  Answer Assessment - Initial Assessment Questions 1. ONSET: "When did the pain begin?"      Monday 2. LOCATION: "Where does it hurt?" (upper, mid or lower back)     Lower back 3. SEVERITY: "How bad is the pain?"  (e.g., Scale 1-10; mild, moderate, or severe)   - MILD (1-3): Doesn't interfere with normal activities.    - MODERATE (4-7): Interferes with normal activities or awakens from sleep.    - SEVERE (8-10): Excruciating pain, unable to do any normal activities.      5/10  4. PATTERN: "Is the pain constant?" (e.g., yes, no; constant, intermittent)      Constant today 5. RADIATION: "Does the pain shoot into your legs or somewhere else?"     To my stomach 6. CAUSE:  "What do you think is causing the back pain?"      Maybe a new medication I started, or a kidney infection  7. BACK OVERUSE:  "Any recent lifting of heavy objects, strenuous work or exercise?"     No 8. MEDICINES: "What have you taken so far for the pain?"  (e.g., nothing, acetaminophen, NSAIDS)     None 9. NEUROLOGIC SYMPTOMS: "Do you have any weakness, numbness, or problems with bowel/bladder control?"     No 10. OTHER SYMPTOMS: "Do you have any other symptoms?" (e.g., fever, abdomen pain, burning with urination, blood in urine)       Abdomen pain  Protocols used: Back Pain-A-AH

## 2023-02-06 NOTE — ED Provider Notes (Signed)
Renaldo Fiddler    CSN: [id number] Arrival date & time: 02/06/23  1327      History   Chief Complaint Chief Complaint  Patient presents with   Back Pain    Entered by patient    HPI Posie Lillibridge is a 67 y.o. female.    Back Pain  Presents to UC with complaint of lower back pain x 3 days.  She denies any injury, trauma or known precipitating event.  She denies fever.  No dysuria.  She endorses a recent medication change to valsartan and wonders if she has constipation as a result.  She endorses no previous history of back issues and reports the pain radiates to her stomach.  Endorses history of UTI.  Past Medical History:  Diagnosis Date   Arthritis    Heart murmur     Patient Active Problem List   Diagnosis Date Noted   Vitamin D deficiency 01/17/2023   Wart of hand 11/28/2022   Cigarette nicotine dependence without complication 11/28/2022   Arthritis 11/28/2022   Anxiety 11/24/2022   Depression 11/24/2022   Heart murmur 11/24/2022   Hypertension 11/24/2022   Dyslipidemia 05/25/2014   AR (allergic rhinitis) 09/09/2011    Past Surgical History:  Procedure Laterality Date   FRACTURE SURGERY     TUBAL LIGATION      OB History   No obstetric history on file.      Home Medications    Prior to Admission medications   Medication Sig Start Date End Date Taking? Authorizing Provider  rosuvastatin (CRESTOR) 10 MG tablet Take 1 tablet (10 mg total) by mouth daily. 01/17/23   Cannady, Corrie Dandy T, NP  sertraline (ZOLOFT) 100 MG tablet Take 1 tablet (100 mg total) by mouth daily. 11/28/22   Cannady, Corrie Dandy T, NP  traZODone (DESYREL) 50 MG tablet Take 2 tablets (100 mg total) by mouth at bedtime as needed. 11/28/22   Cannady, Corrie Dandy T, NP  valsartan (DIOVAN) 40 MG tablet Take 1 tablet (40 mg total) by mouth daily. 01/16/23   Marjie Skiff, NP    Family History Family History  Problem Relation Age of Onset   Heart disease Mother    Emphysema  Mother    Diabetes Mother    Diabetes Sister    Diabetes Sister    Aneurysm Maternal Grandfather        brain   Heart disease Paternal Grandfather     Social History Social History   Tobacco Use   Smoking status: Every Day    Current packs/day: 1.00    Average packs/day: 1 pack/day for 50.0 years (50.0 ttl pk-yrs)    Types: Cigarettes    Passive exposure: Past   Smokeless tobacco: Never  Vaping Use   Vaping status: Never Used  Substance Use Topics   Alcohol use: Not Currently   Drug use: Never     Allergies   Patient has no known allergies.   Review of Systems Review of Systems  Musculoskeletal:  Positive for back pain.     Physical Exam Triage Vital Signs ED Triage Vitals  Encounter Vitals Group     BP      Systolic BP Percentile      Diastolic BP Percentile      Pulse      Resp      Temp      Temp src      SpO2      Weight      Height  Head Circumference      Peak Flow      Pain Score      Pain Loc      Pain Education      Exclude from Growth Chart    No data found.  Updated Vital Signs There were no vitals taken for this visit.  Visual Acuity Right Eye Distance:   Left Eye Distance:   Bilateral Distance:    Right Eye Near:   Left Eye Near:    Bilateral Near:     Physical Exam   UC Treatments / Results  Labs (all labs ordered are listed, but only abnormal results are displayed) Labs Reviewed - No data to display  EKG   Radiology No results found.  Procedures Procedures (including critical care time)  Medications Ordered in UC Medications - No data to display  Initial Impression / Assessment and Plan / UC Course  I have reviewed the triage vital signs and the nursing notes.  Pertinent labs & imaging results that were available during my care of the patient were reviewed by me and considered in my medical decision making (see chart for details).   Camreigh Michie is a 67 y.o. female presenting with abdominal  pain. Patient is afebrile without recent antipyretics, satting well on room air. Overall is well appearing, well hydrated, without respiratory distress. Pulmonary exam is unremarkable.  Lungs CTAB without wheezing, rhonchi, rales. RRR without murmurs, rubs, gallops. Generalized abdominal tenderness is present. Bilateral lower back pain is present.  Reviewed relevant chart history.   Unclear etiology for her symptoms.  Patient is unable to provide a urine sample.  Differential diagnoses include simple uncomplicated UTI, pyelonephritis, renal calculi.  Counseled patient regarding ER precautions for worsening symptoms.  Patient will attempt to return with urine sample for analysis.  Will follow-up with her PCP if symptoms continue.  Final Clinical Impressions(s) / UC Diagnoses   Final diagnoses:  None   Discharge Instructions   None    ED Prescriptions   None    PDMP not reviewed this encounter.   Charma Igo, [place] 02/06/23 1546

## 2023-02-06 NOTE — Telephone Encounter (Signed)
Error

## 2023-02-06 NOTE — ED Notes (Signed)
Patient discharged home with instructions to bring urine sample back whenever she can get it.

## 2023-02-06 NOTE — Telephone Encounter (Signed)
Noted, agree with UC visit today.

## 2023-02-06 NOTE — Discharge Instructions (Addendum)
Please return with a urine sample.  Follow-up with your primary care provider if your symptoms continue.  Go to the emergency room if your symptoms worsen.

## 2023-02-09 NOTE — Patient Instructions (Signed)
Be Involved in Caring For Your Health:  Taking Medications When medications are taken as directed, they can greatly improve your health. But if they are not taken as prescribed, they may not work. In some cases, not taking them correctly can be harmful. To help ensure your treatment remains effective and safe, understand your medications and how to take them. Bring your medications to each visit for review by your provider.  Your lab results, notes, and after visit summary will be available on My Chart. We strongly encourage you to use this feature. If lab results are abnormal the clinic will contact you with the appropriate steps. If the clinic does not contact you assume the results are satisfactory. You can always view your results on My Chart. If you have questions regarding your health or results, please contact the clinic during office hours. You can also ask questions on My Chart.  We at [facility] are grateful that you chose [place] to provide your care. We strive to provide evidence-based and compassionate care and are always looking for feedback. If you get a survey from the clinic please complete this so we can hear your opinions.  Heart-Healthy Eating Plan Many factors influence your heart health, including eating and exercise habits. Heart health is also called coronary health. Coronary risk increases with abnormal blood fat (lipid) levels. A heart-healthy eating plan includes limiting unhealthy fats, increasing healthy fats, limiting salt (sodium) intake, and making other diet and lifestyle changes. What is my plan? Your health care provider may recommend that: You limit your fat intake to _________% or less of your total calories each day. You limit your saturated fat intake to _________% or less of your total calories each day. You limit the amount of cholesterol in your diet to less than _________ mg per day. You limit the amount of sodium in your diet to less than _________  mg per day. What are tips for following this plan? Cooking Cook foods using methods other than frying. Baking, boiling, grilling, and broiling are all good options. Other ways to reduce fat include: Removing the skin from poultry. Removing all visible fats from meats. Steaming vegetables in water or broth. Meal planning  At meals, imagine dividing your plate into fourths: Fill one-half of your plate with vegetables and green salads. Fill one-fourth of your plate with whole grains. Fill one-fourth of your plate with lean protein foods. Eat 2-4 cups of vegetables per day. One cup of vegetables equals 1 cup (91 g) broccoli or cauliflower florets, 2 medium carrots, 1 large bell pepper, 1 large sweet potato, 1 large tomato, 1 medium white potato, 2 cups (150 g) raw leafy greens. Eat 1-2 cups of fruit per day. One cup of fruit equals 1 small apple, 1 large banana, 1 cup (237 g) mixed fruit, 1 large orange,  cup (82 g) dried fruit, 1 cup (240 mL) 100% fruit juice. Eat more foods that contain soluble fiber. Examples include apples, broccoli, carrots, beans, peas, and barley. Aim to get 25-30 g of fiber per day. Increase your consumption of legumes, nuts, and seeds to 4-5 servings per week. One serving of dried beans or legumes equals  cup (90 g) cooked, 1 serving of nuts is  oz (12 almonds, 24 pistachios, or 7 walnut halves), and 1 serving of seeds equals  oz (8 g). Fats Choose healthy fats more often. Choose monounsaturated and polyunsaturated fats, such as olive and canola oils, avocado oil, flaxseeds, walnuts, almonds, and seeds. Eat  more omega-3 fats. Choose salmon, mackerel, sardines, tuna, flaxseed oil, and ground flaxseeds. Aim to eat fish at least 2 times each week. Check food labels carefully to identify foods with trans fats or high amounts of saturated fat. Limit saturated fats. These are found in animal products, such as meats, butter, and cream. Plant sources of saturated fats  include palm oil, palm kernel oil, and coconut oil. Avoid foods with partially hydrogenated oils in them. These contain trans fats. Examples are stick margarine, some tub margarines, cookies, crackers, and other baked goods. Avoid fried foods. General information Eat more home-cooked food and less restaurant, buffet, and fast food. Limit or avoid alcohol. Limit foods that are high in added sugar and simple starches such as foods made using white refined flour (white breads, pastries, sweets). Lose weight if you are overweight. Losing just 5-10% of your body weight can help your overall health and prevent diseases such as diabetes and heart disease. Monitor your sodium intake, especially if you have high blood pressure. Talk with your health care provider about your sodium intake. Try to incorporate more vegetarian meals weekly. What foods should I eat? Fruits All fresh, canned (in natural juice), or frozen fruits. Vegetables Fresh or frozen vegetables (raw, steamed, roasted, or grilled). Green salads. Grains Most grains. Choose whole wheat and whole grains most of the time. Rice and pasta, including brown rice and pastas made with whole wheat. Meats and other proteins Lean, well-trimmed beef, veal, pork, and lamb. Chicken and [place] without skin. All fish and shellfish. Wild duck, rabbit, pheasant, and venison. Egg whites or low-cholesterol egg substitutes. Dried beans, peas, lentils, and tofu. Seeds and most nuts. Dairy Low-fat or nonfat cheeses, including ricotta and mozzarella. Skim or 1% milk (liquid, powdered, or evaporated). Buttermilk made with low-fat milk. Nonfat or low-fat yogurt. Fats and oils Non-hydrogenated (trans-free) margarines. Vegetable oils, including soybean, sesame, sunflower, olive, avocado, peanut, safflower, corn, canola, and cottonseed. Salad dressings or mayonnaise made with a vegetable oil. Beverages Water (mineral or sparkling). Coffee and tea. Unsweetened ice  tea. Diet beverages. Sweets and desserts Sherbet, gelatin, and fruit ice. Small amounts of dark chocolate. Limit all sweets and desserts. Seasonings and condiments All seasonings and condiments. The items listed above may not be a complete list of foods and beverages you can eat. Contact a dietitian for more options. What foods should I avoid? Fruits Canned fruit in heavy syrup. Fruit in cream or butter sauce. Fried fruit. Limit coconut. Vegetables Vegetables cooked in cheese, cream, or butter sauce. Fried vegetables. Grains Breads made with saturated or trans fats, oils, or whole milk. Croissants. Sweet rolls. Donuts. High-fat crackers, such as cheese crackers and chips. Meats and other proteins Fatty meats, such as hot dogs, ribs, sausage, bacon, rib-eye roast or steak. High-fat deli meats, such as salami and bologna. Caviar. Domestic duck and goose. Organ meats, such as liver. Dairy Cream, sour cream, cream cheese, and creamed cottage cheese. Whole-milk cheeses. Whole or 2% milk (liquid, evaporated, or condensed). Whole buttermilk. Cream sauce or high-fat cheese sauce. Whole-milk yogurt. Fats and oils Meat fat, or shortening. Cocoa butter, hydrogenated oils, palm oil, coconut oil, palm kernel oil. Solid fats and shortenings, including bacon fat, salt pork, lard, and butter. Nondairy cream substitutes. Salad dressings with cheese or sour cream. Beverages Regular sodas and any drinks with added sugar. Sweets and desserts Frosting. Pudding. Cookies. Cakes. Pies. Milk chocolate or white chocolate. Buttered syrups. Full-fat ice cream or ice cream drinks. The items listed above may  not be a complete list of foods and beverages to avoid. Contact a dietitian for more information. Summary Heart-healthy meal planning includes limiting unhealthy fats, increasing healthy fats, limiting salt (sodium) intake and making other diet and lifestyle changes. Lose weight if you are overweight. Losing just  5-10% of your body weight can help your overall health and prevent diseases such as diabetes and heart disease. Focus on eating a balance of foods, including fruits and vegetables, low-fat or nonfat dairy, lean protein, nuts and legumes, whole grains, and heart-healthy oils and fats. This information is not intended to replace advice given to you by your health care provider. Make sure you discuss any questions you have with your health care provider. Document Revised: 08/20/2021 Document Reviewed: 08/20/2021 Elsevier Patient Education  2024 [employer].

## 2023-02-13 ENCOUNTER — Ambulatory Visit (INDEPENDENT_AMBULATORY_CARE_PROVIDER_SITE_OTHER): Payer: Medicare PPO | Admitting: Nurse Practitioner

## 2023-02-13 ENCOUNTER — Other Ambulatory Visit: Payer: Self-pay | Admitting: Nurse Practitioner

## 2023-02-13 ENCOUNTER — Encounter: Payer: Self-pay | Admitting: Nurse Practitioner

## 2023-02-13 VITALS — BP 133/81 | HR 60 | Temp 98.0°F | Ht 63.86 in | Wt 133.6 lb

## 2023-02-13 DIAGNOSIS — R0789 Other chest pain: Secondary | ICD-10-CM

## 2023-02-13 DIAGNOSIS — I10 Essential (primary) hypertension: Secondary | ICD-10-CM

## 2023-02-13 DIAGNOSIS — H9313 Tinnitus, bilateral: Secondary | ICD-10-CM | POA: Diagnosis not present

## 2023-02-13 MED ORDER — BENAZEPRIL HCL 20 MG PO TABS: 20.0000 mg | ORAL_TABLET | Freq: Every day | ORAL | 4 refills | Status: DC

## 2023-02-13 NOTE — Assessment & Plan Note (Signed)
Chronic,ongoing.  BP improving, however not tolerating Valsartan.  Discussed at length with patient and joint decision to change medication.  Will start Benazepril 20 MG daily, which she tolerated in past -- would prefer ARB due to her COPD, but she tolerated ACE better in past.  Continue diet and exercise focus + maintaining weight loss.  Recommend she monitor BP at least a few mornings a week at home and document.  DASH diet at home.  Labs today: check BMP next visit.  Recommend cut back on smoking.  Return in 6 weeks.

## 2023-02-13 NOTE — Telephone Encounter (Signed)
Medication Refill - Medication: valsartan (DIOVAN) 40 MG tablet  rosuvastatin (CRESTOR) 10 MG tablet  Has the patient contacted their pharmacy? Yes.   [name] the pharmacist called for the refill  Preferred Pharmacy (with phone number or street name): [facility] - [place], [place] - [address]  Phone: [phone] Fax: [phone]  Has the patient been seen for an appointment in the last year OR does the patient have an upcoming appointment? No.  Agent: Please be advised that RX refills may take up to 3 business days. We ask that you follow-up with your pharmacy.

## 2023-02-13 NOTE — Telephone Encounter (Signed)
Valsartan discontinued today by provider per [facility] notes.

## 2023-02-13 NOTE — Assessment & Plan Note (Signed)
Suspect related to past loud work environment.  Will monitor and send to ENT as needed.

## 2023-02-13 NOTE — Progress Notes (Signed)
BP 133/81   Pulse 60   Temp 98 F (36.7 C) (Oral)   Ht 5' 3.86" (1.622 m)   Wt 133 lb 9.6 oz (60.6 kg)   SpO2 98%   BMI 23.03 kg/m    Subjective:    Patient ID: Debra Munoz, female    DOB: [birth year], 67 y.o.   MRN: [id number]  HPI: Debra Munoz is a 67 y.o. female  Chief Complaint  Patient presents with   Hypertension    Added Valsartan 40 mg at last visit. Patient states that she feeling like her heart is beating to hard since starting medication   HYPERTENSION with Chronic Kidney Disease Started Valsartan on 01/16/23, this made her heart feel like it was beating fast and heavy.  Took Benazepril in past and tolerated this without cough + had improved BP.  Is tolerating Rosuvastatin for HLD.  Hypertension status: stable  Satisfied with current treatment? yes Duration of hypertension: chronic BP monitoring frequency:  daily BP range: 130/80 BP medication side effects:  no Medication compliance: good compliance Previous BP meds: Valsartan Aspirin: no Recurrent headaches: no Visual changes: no Palpitations: no Dyspnea:  mild on occasion Chest pain: no Lower extremity edema: no Dizzy/lightheaded: no   TINNITUS Has been present for > 1 year. 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 Interim medical history  since our last visit reviewed. Allergies and medications reviewed and updated.  Review of Systems  Constitutional:  Negative for activity change, appetite change, diaphoresis, fatigue and fever.  HENT:  Positive for tinnitus.   Respiratory:  Negative for cough, chest tightness, shortness of breath and wheezing.   Cardiovascular:  Negative for chest pain, palpitations and leg swelling.  Gastrointestinal: Negative.   Neurological: Negative.   Psychiatric/Behavioral: Negative.     Per HPI unless specifically indicated above     Objective:    BP 133/81   Pulse 60   Temp 98 F (36.7 C) (Oral)   Ht 5' 3.86" (1.622 m)   Wt 133 lb 9.6 oz (60.6 kg)   SpO2 98%   BMI 23.03 kg/m   Wt Readings from Last 3 Encounters:  02/13/23 133 lb 9.6 oz (60.6 kg)  01/16/23 133 lb 6.4 oz (60.5 kg)  11/28/22 131 lb 6.4 oz (59.6 kg)    Physical Exam Vitals and nursing note reviewed.  Constitutional:      General: She is awake. She is not in acute distress.    Appearance: She is well-developed and well-groomed. She is not ill-appearing or toxic-appearing.  HENT:     Head: Normocephalic.     Right Ear: Hearing and external ear normal.     Left Ear: Hearing and external ear normal.  Eyes:     General: Lids are normal.        Right eye: No discharge.        Left eye: No discharge.     Conjunctiva/sclera: Conjunctivae normal.     Pupils: Pupils  are equal, round, and reactive to light.  Neck:     Thyroid: No thyromegaly.     Vascular: No carotid bruit.  Cardiovascular:     Rate and Rhythm: Normal rate and regular rhythm.     Heart sounds: Murmur heard.     Systolic murmur is present with a grade of 2/6.     No gallop.  Pulmonary:     Effort: Pulmonary effort is normal. No accessory muscle usage or respiratory distress.     Breath sounds: Normal breath sounds. No decreased breath sounds, wheezing or rhonchi.  Abdominal:     General: Bowel sounds are normal. There is no distension.      Palpations: Abdomen is soft.     Tenderness: There is no abdominal tenderness.  Musculoskeletal:     Cervical back: Normal range of motion and neck supple.     Right lower leg: No edema.     Left lower leg: No edema.  Lymphadenopathy:     Cervical: No cervical adenopathy.  Skin:    General: Skin is warm and dry.  Neurological:     Mental Status: She is alert and oriented to person, place, and time.     Deep Tendon Reflexes: Reflexes are normal and symmetric.     Reflex Scores:      Brachioradialis reflexes are 2+ on the right side and 2+ on the left side.      Patellar reflexes are 2+ on the right side and 2+ on the left side. Psychiatric:        Attention and Perception: Attention normal.        Mood and Affect: Mood normal.        Speech: Speech normal.        Behavior: Behavior normal. Behavior is cooperative.        Thought Content: Thought content normal.   EKG My review and personal interpretation at Time: 0915   Indication: htn  Rate: 64  Rhythm: sinus Axis: normal Other: No nonspecific st abn, no stemi, no lvh  Results for orders placed or performed in visit on 01/16/23  CBC with Differential/Platelet  Result Value Ref Range   WBC 5.6 3.4 - 10.8 x10E3/uL   RBC 4.57 3.77 - 5.28 x10E6/uL   Hemoglobin 13.2 11.1 - 15.9 g/dL   Hematocrit 40.9 81.1 - 46.6 %   MCV 90 79 - 97 fL   MCH 28.9 26.6 - 33.0 pg   MCHC 32.3 31.5 - 35.7 g/dL   RDW 91.4 78.2 - 95.6 %   Platelets 245 150 - 450 x10E3/uL   Neutrophils 71 Not Estab. %   Lymphs 18 Not Estab. %   Monocytes 8 Not Estab. %   Eos 2 Not Estab. %   Basos 1 Not Estab. %   Neutrophils Absolute 3.9 1.4 - 7.0 x10E3/uL   Lymphocytes Absolute 1.0 0.7 - 3.1 x10E3/uL   Monocytes Absolute 0.5 0.1 - 0.9 x10E3/uL   EOS (ABSOLUTE) 0.1 0.0 - 0.4 x10E3/uL   Basophils Absolute 0.0 0.0 - 0.2 x10E3/uL   Immature Granulocytes 0 Not Estab. %   Immature Grans (Abs) 0.0 0.0 - 0.1 x10E3/uL  Comprehensive metabolic panel  Result Value Ref  Range   Glucose 82 70 - 99 mg/dL   BUN 12 8 - 27 mg/dL   Creatinine, Ser 2.13 0.57 - 1.00 mg/dL   eGFR 77 >08 MV/HQI/6.96   BUN/Creatinine Ratio 15 12 - 28   Sodium 138 134 - 144 mmol/L  Potassium 4.1 3.5 - 5.2 mmol/L   Chloride 101 96 - 106 mmol/L   CO2 24 20 - 29 mmol/L   Calcium 9.2 8.7 - 10.3 mg/dL   Total Protein 6.2 6.0 - 8.5 g/dL   Albumin 4.2 3.9 - 4.9 g/dL   Globulin, Total 2.0 1.5 - 4.5 g/dL   Bilirubin Total 0.3 0.0 - 1.2 mg/dL   Alkaline Phosphatase 90 44 - 121 IU/L   AST 13 0 - 40 IU/L   ALT 9 0 - 32 IU/L  Lipid Panel w/o Chol/HDL Ratio  Result Value Ref Range   Cholesterol, Total 183 100 - 199 mg/dL   Triglycerides 161 0 - 149 mg/dL   HDL 46 >09 mg/dL   VLDL Cholesterol Cal 22 5 - 40 mg/dL   LDL Chol Calc (NIH) 604 (H) 0 - 99 mg/dL  TSH  Result Value Ref Range   TSH 2.030 0.450 - 4.500 uIU/mL  VITAMIN D 25 Hydroxy (Vit-D Deficiency, Fractures)  Result Value Ref Range   Vit D, 25-Hydroxy 27.1 (L) 30.0 - 100.0 ng/mL      Assessment & Plan:   Problem List Items Addressed This Visit       Cardiovascular and Mediastinum   Hypertension - Primary    Chronic,ongoing.  BP improving, however not tolerating Valsartan.  Discussed at length with patient and joint decision to change medication.  Will start Benazepril 20 MG daily, which she tolerated in past -- would prefer ARB due to her COPD, but she tolerated ACE better in past.  Continue diet and exercise focus + maintaining weight loss.  Recommend she monitor BP at least a few mornings a week at home and document.  DASH diet at home.  Labs today: check BMP next visit.  Recommend cut back on smoking.  Return in 6 weeks.       Relevant Medications   benazepril (LOTENSIN) 20 MG tablet     Other   Tinnitus aurium, bilateral    Suspect related to past loud work environment.  Will monitor and send to ENT as needed.      Other Visit Diagnoses     Chest heaviness       Overall reassuring EKG, suspect Valsartan  effect, stop this and start Benazepril.   Relevant Orders   EKG 12-Lead (Completed)        Follow up plan: Return in about 6 weeks (around 03/27/2023) for HTN -- started Benazepril.

## 2023-02-14 NOTE — Telephone Encounter (Signed)
Requested Prescriptions  Refused Prescriptions Disp Refills   rosuvastatin (CRESTOR) 10 MG tablet 45 tablet 3    Sig: Take 1 tablet (10 mg total) by mouth daily.     Cardiovascular:  Antilipid - Statins 2 Failed - 02/13/2023  9:53 AM      Failed - Lipid Panel in normal range within the last 12 months    Cholesterol, Total  Date Value Ref Range Status  01/16/2023 183 100 - 199 mg/dL Final   LDL Chol Calc (NIH)  Date Value Ref Range Status  01/16/2023 115 (H) 0 - 99 mg/dL Final   HDL  Date Value Ref Range Status  01/16/2023 46 >39 mg/dL Final   Triglycerides  Date Value Ref Range Status  01/16/2023 125 0 - 149 mg/dL Final         Passed - Cr in normal range and within 360 days    Creatinine, Ser  Date Value Ref Range Status  01/16/2023 0.82 0.57 - 1.00 mg/dL Final         Passed - Patient is not pregnant      Passed - Valid encounter within last 12 months    Recent Outpatient Visits           Yesterday Primary hypertension   [facility] [place], [place] T, NP   4 weeks ago Medicare annual wellness visit, subsequent   [facility] [place], [place] T, NP   2 months ago Major depressive disorder with single episode, in partial remission (HCC)   [place] [place], Dorie Rank, NP       Future Appointments             In 1 month [place], Dorie Rank, NP [facility]

## 2023-03-10 ENCOUNTER — Inpatient Hospital Stay: Admission: RE | Admit: 2023-03-10 | Payer: Medicare PPO | Source: Ambulatory Visit

## 2023-03-11 ENCOUNTER — Ambulatory Visit (INDEPENDENT_AMBULATORY_CARE_PROVIDER_SITE_OTHER): Payer: Medicare PPO | Admitting: Acute Care

## 2023-03-11 ENCOUNTER — Encounter: Payer: Self-pay | Admitting: Acute Care

## 2023-03-11 DIAGNOSIS — F17210 Nicotine dependence, cigarettes, uncomplicated: Secondary | ICD-10-CM | POA: Diagnosis not present

## 2023-03-11 NOTE — Progress Notes (Signed)
Virtual Visit via Telephone Note  I connected with Debra Munoz on 03/11/23 at  3:00 PM EDT by telephone and verified that I am speaking with the correct person using two identifiers.  Location: Patient:  Debra Munoz Provider: [address] [place], [place], [address]    I discussed the limitations, risks, security and privacy concerns of performing an evaluation and management service by telephone and the availability of in person appointments. I also discussed with the patient that there may be a patient responsible charge related to this service. The patient expressed understanding and agreed to proceed.    Shared Decision Making Visit Lung Cancer Screening Program [phone])   Eligibility: Age 67 y.o. Pack Years Smoking History Calculation 48 pack year smoking history (# packs/per year x # years smoked) Recent History of coughing up blood  no Unexplained weight loss? no ( >Than 15 pounds within the last 6 months ) Prior History Lung / other cancer no (Diagnosis within the last 5 years already requiring surveillance chest CT Scans). Smoking Status Current Smoker Former Smokers: Years since quit: NA  Quit Date:  NA  Visit Components: Discussion included one or more decision making aids. yes Discussion included risk/benefits of screening. yes Discussion included potential follow up diagnostic testing for abnormal scans. yes Discussion included meaning and risk of over diagnosis. yes Discussion included meaning and risk of False Positives. yes Discussion included meaning of total radiation exposure. yes  Counseling Included: Importance of adherence to annual lung cancer LDCT screening. yes Impact of comorbidities on ability to participate in the program. yes Ability and willingness to under diagnostic treatment. yes  Smoking Cessation Counseling: Current Smokers:  Discussed importance of smoking cessation. yes Information about tobacco cessation classes and  interventions provided to patient. yes Patient provided with "ticket" for LDCT Scan. yes Symptomatic Patient. no  Counseling NA Diagnosis Code: Tobacco Use Z72.0 Asymptomatic Patient yes  Counseling (Intermediate counseling: > three minutes counseling) [id number] Former Smokers:  Discussed the importance of maintaining cigarette abstinence. yes Diagnosis Code: Personal History of Nicotine Dependence. X52.841 Information about tobacco cessation classes and interventions provided to patient. Yes Patient provided with "ticket" for LDCT Scan. yes Written Order for Lung Cancer Screening with LDCT placed in Epic. Yes (CT Chest Lung Cancer Screening Low Dose W/O CM) [id number] Z12.2-Screening of respiratory organs Z87.891-Personal history of nicotine dependence  I have spent 25 minutes of face to face/ virtual visit   time with  Debra Munoz discussing the risks and benefits of lung cancer screening. We viewed / discussed a power point together that explained in detail the above noted topics. We paused at intervals to allow for questions to be asked and answered to ensure understanding.We discussed that the single most powerful action that she can take to decrease her risk of developing lung cancer is to quit smoking. We discussed whether or not she is ready to commit to setting a quit date. We discussed options for tools to aid in quitting smoking including nicotine replacement therapy, non-nicotine medications, support groups, Quit Smart classes, and behavior modification. We discussed that often times setting smaller, more achievable goals, such as eliminating 1 cigarette a day for a week and then 2 cigarettes a day for a week can be helpful in slowly decreasing the number of cigarettes smoked. This allows for a sense of accomplishment as well as providing a clinical benefit. I provided  her  with smoking cessation  information  with contact information for community resources, classes,  free nicotine replacement  therapy, and access to mobile apps, text messaging, and on-line smoking cessation help. I have also provided  her  the office contact information in the event she needs to contact me, or the screening staff. We discussed the time and location of the scan, and that either Abigail Miyamoto RN, Karlton Lemon, RN  or I will call / send a letter with the results within 24-72 hours of receiving them. The patient verbalized understanding of all of  the above and had no further questions upon leaving the office. They have my contact information in the event they have any further questions.  I spent 3-4 minutes counseling on smoking cessation and the health risks of continued tobacco abuse.  I explained to the patient that there has been a high incidence of coronary artery disease noted on these exams. I explained that this is a non-gated exam therefore degree or severity cannot be determined. This patient is on statin therapy. I have asked the patient to follow-up with their PCP regarding any incidental finding of coronary artery disease and management with diet or medication as their PCP  feels is clinically indicated. The patient verbalized understanding of the above and had no further questions upon completion of the visit.      Bevelyn Ngo, NP 03/11/2023

## 2023-03-11 NOTE — Patient Instructions (Signed)
 Thank you for participating in the [facility]. It was our pleasure to meet you today. We will call you with the results of your scan within the next few days. Your scan will be assigned a Lung RADS category score by the physicians reading the scans.  This Lung RADS score determines follow up scanning.  See below for description of categories, and follow up screening recommendations. We will be in touch to schedule your follow up screening annually or based on recommendations of our providers. We will fax a copy of your scan results to your Primary Care Physician, or the physician who referred you to the program, to ensure they have the results. Please call the office if you have any questions or concerns regarding your scanning experience or results.  Our office number is [phone]. Please speak with Abigail Miyamoto, RN. , or  Karlton Lemon RN, They are  our Lung Cancer Screening RN.'s If They are unavailable when you call, Please leave a message on the voice mail. We will return your call at our earliest convenience.This voice mail is monitored several times a day.  Remember, if your scan is normal, we will scan you annually as long as you continue to meet the criteria for the program. (Age 69-80, Current smoker or smoker who has quit within the last 15 years). If you are a smoker, remember, quitting is the single most powerful action that you can take to decrease your risk of lung cancer and other pulmonary, breathing related problems. We know quitting is hard, and we are here to help.  Please let us know if there is anything we can do to help you meet your goal of quitting. If you are a former smoker, Counselling psychologist. We are proud of you! Remain smoke free! Remember you can refer friends or family members through the number above.  We will screen them to make sure they meet criteria for the program. Thank you for helping [place] take better care of you by  participating in Lung Screening.   Lung RADS Categories:  Lung RADS 1: no nodules or definitely non-concerning nodules.  Recommendation is for a repeat annual scan in 12 months.  Lung RADS 2:  nodules that are non-concerning in appearance and behavior with a very low likelihood of becoming an active cancer. Recommendation is for a repeat annual scan in 12 months.  Lung RADS 3: nodules that are probably non-concerning , includes nodules with a low likelihood of becoming an active cancer.  Recommendation is for a 32-month repeat screening scan. Often noted after an upper respiratory illness. We will be in touch to make sure you have no questions, and to schedule your 31-month scan.  Lung RADS 4 A: nodules with concerning findings, recommendation is most often for a follow up scan in 3 months or additional testing based on our provider's assessment of the scan. We will be in touch to make sure you have no questions and to schedule the recommended 3 month follow up scan.  Lung RADS 4 B:  indicates findings that are concerning. We will be in touch with you to schedule additional diagnostic testing based on our provider's  assessment of the scan.  You can receive free nicotine replacement therapy ( patches, gum or mints) by calling 1-800-QUIT NOW. Please call so we can get you on the path to becoming  a non-smoker. I know it is hard, but you can do this!  Other options for assistance in smoking  cessation ( As covered by your insurance benefits)  Hypnosis for smoking cessation  [employer] [phone]  Acupuncture for smoking cessation  [employer] [phone]

## 2023-03-12 ENCOUNTER — Ambulatory Visit
Admission: RE | Admit: 2023-03-12 | Discharge: 2023-03-12 | Disposition: A | Discharge status: Home / Self Care | Payer: Medicare PPO | Source: Ambulatory Visit | Attending: Acute Care | Admitting: Acute Care

## 2023-03-12 DIAGNOSIS — Z87891 Personal history of nicotine dependence: Secondary | ICD-10-CM | POA: Diagnosis present

## 2023-03-12 DIAGNOSIS — F17210 Nicotine dependence, cigarettes, uncomplicated: Secondary | ICD-10-CM | POA: Diagnosis not present

## 2023-03-12 DIAGNOSIS — Z122 Encounter for screening for malignant neoplasm of respiratory organs: Secondary | ICD-10-CM | POA: Diagnosis present

## 2023-03-18 ENCOUNTER — Telehealth: Payer: Self-pay | Admitting: Acute Care

## 2023-03-18 DIAGNOSIS — Z122 Encounter for screening for malignant neoplasm of respiratory organs: Secondary | ICD-10-CM

## 2023-03-18 DIAGNOSIS — Z87891 Personal history of nicotine dependence: Secondary | ICD-10-CM

## 2023-03-18 DIAGNOSIS — R911 Solitary pulmonary nodule: Secondary | ICD-10-CM

## 2023-03-18 NOTE — Telephone Encounter (Signed)
Called and spoke to patient regarding her LDCT results. Discussed results with patient and the nodule of note at 6mm in size and the recommendation to have a 6 month repeat scan. Patient verbalized understanding and is agreeable to repeat scan. Order placed. Results and plan sent to patient's PCP. Impression of LDCT is below.    IMPRESSION: 1. Lung-RADS 3, probably benign findings. Short-term follow-up in 6 months is recommended with repeat low-dose chest CT without contrast (please use the following order, "CT CHEST LCS NODULE FOLLOW-UP W/O CM"). Right lower lobe pulmonary nodule of 6.0 mm. 2. Aortic Atherosclerosis (ICD10-I70.0) and Emphysema (ICD10-J43.9).     Electronically Signed   By: Jeronimo Greaves M.D.   On: 03/18/2023 09:04

## 2023-03-23 DIAGNOSIS — R911 Solitary pulmonary nodule: Secondary | ICD-10-CM | POA: Insufficient documentation

## 2023-03-23 DIAGNOSIS — J432 Centrilobular emphysema: Secondary | ICD-10-CM | POA: Insufficient documentation

## 2023-03-23 DIAGNOSIS — I700 Atherosclerosis of aorta: Secondary | ICD-10-CM | POA: Insufficient documentation

## 2023-03-23 NOTE — Patient Instructions (Addendum)
Your mammogram is scheduled for 04/23/23 at 10:00 AM at [facility] at [facility]. [address] [place],  [place]  [address] Phone: [phone]      Be Involved in Caring For Your Health:  Taking Medications When medications are taken as directed, they can greatly improve your health. But if they are not taken as prescribed, they may not work. In some cases, not taking them correctly can be harmful. To help ensure your treatment remains effective and safe, understand your medications and how to take them. Bring your medications to each visit for review by your provider.  Your lab results, notes, and after visit summary will be available on My Chart. We strongly encourage you to use this feature. If lab results are abnormal the clinic will contact you with the appropriate steps. If the clinic does not contact you assume the results are satisfactory. You can always view your results on My Chart. If you have questions regarding your health or results, please contact the clinic during office hours. You can also ask questions on My Chart.  We at [facility] are grateful that you chose [place] to provide your care. We strive to provide evidence-based and compassionate care and are always looking for feedback. If you get a survey from the clinic please complete this so we can hear your opinions.  DASH Eating Plan DASH stands for Dietary Approaches to Stop Hypertension. The DASH eating plan is a healthy eating plan that has been shown to: Lower high blood pressure (hypertension). Reduce your risk for type 2 diabetes, heart disease, and stroke. Help with weight loss. What are tips for following this plan? Reading food labels Check food labels for the amount of salt (sodium) per serving. Choose foods with less than 5 percent of the Daily Value (DV) of sodium. In general, foods with less than 300 milligrams (mg) of sodium per serving fit into this eating plan. To find whole  grains, look for the word "whole" as the first word in the ingredient list. Shopping Buy products labeled as "low-sodium" or "no salt added." Buy fresh foods. Avoid canned foods and pre-made or frozen meals. Cooking Try not to add salt when you cook. Use salt-free seasonings or herbs instead of table salt or sea salt. Check with your health care provider or pharmacist before using salt substitutes. Do not fry foods. Cook foods in healthy ways, such as baking, boiling, grilling, roasting, or broiling. Cook using oils that are good for your heart. These include olive, canola, avocado, soybean, and sunflower oil. Meal planning  Eat a balanced diet. This should include: 4 or more servings of fruits and 4 or more servings of vegetables each day. Try to fill half of your plate with fruits and vegetables. 6-8 servings of whole grains each day. 6 or less servings of lean meat, poultry, or fish each day. 1 oz is 1 serving. A 3 oz (85 g) serving of meat is about the same size as the palm of your hand. One egg is 1 oz (28 g). 2-3 servings of low-fat dairy each day. One serving is 1 cup (237 mL). 1 serving of nuts, seeds, or beans 5 times each week. 2-3 servings of heart-healthy fats. Healthy fats called omega-3 fatty acids are found in foods such as walnuts, flaxseeds, fortified milks, and eggs. These fats are also found in cold-water fish, such as sardines, salmon, and mackerel. Limit how much you eat of: Canned or prepackaged foods. Food that  is high in trans fat, such as fried foods. Food that is high in saturated fat, such as fatty meat. Desserts and other sweets, sugary drinks, and other foods with added sugar. Full-fat dairy products. Do not salt foods before eating. Do not eat more than 4 egg yolks a week. Try to eat at least 2 vegetarian meals a week. Eat more home-cooked food and less restaurant, buffet, and fast food. Lifestyle When eating at a restaurant, ask if your food can be made with  less salt or no salt. If you drink alcohol: Limit how much you have to: 0-1 drink a day if you are female. 0-2 drinks a day if you are female. Know how much alcohol is in your drink. In the [place] one drink is one 12 oz bottle of beer (355 mL), one 5 oz glass of wine (148 mL), or one 1 oz glass of hard liquor (44 mL). General information Avoid eating more than 2,300 mg of salt a day. If you have hypertension, you may need to reduce your sodium intake to 1,500 mg a day. Work with your provider to stay at a healthy body weight or lose weight. Ask what the best weight range is for you. On most days of the week, get at least 30 minutes of exercise that causes your heart to beat faster. This may include walking, swimming, or biking. Work with your provider or dietitian to adjust your eating plan to meet your specific calorie needs. What foods should I eat? Fruits All fresh, dried, or frozen fruit. Canned fruits that are in their natural juice and do not have sugar added to them. Vegetables Fresh or frozen vegetables that are raw, steamed, roasted, or grilled. Low-sodium or reduced-sodium tomato and vegetable juice. Low-sodium or reduced-sodium tomato sauce and tomato paste. Low-sodium or reduced-sodium canned vegetables. Grains Whole-grain or whole-wheat bread. Whole-grain or whole-wheat pasta. Brown rice. Orpah Cobb. Bulgur. Whole-grain and low-sodium cereals. Pita bread. Low-fat, low-sodium crackers. Whole-wheat flour tortillas. Meats and other proteins Skinless chicken or [place]. Ground chicken or [place]. Pork with fat trimmed off. Fish and seafood. Egg whites. Dried beans, peas, or lentils. Unsalted nuts, nut butters, and seeds. Unsalted canned beans. Lean cuts of beef with fat trimmed off. Low-sodium, lean precooked or cured meat, such as sausages or meat loaves. Dairy Low-fat (1%) or fat-free (skim) milk. Reduced-fat, low-fat, or fat-free cheeses. Nonfat, low-sodium ricotta or cottage  cheese. Low-fat or nonfat yogurt. Low-fat, low-sodium cheese. Fats and oils Soft margarine without trans fats. Vegetable oil. Reduced-fat, low-fat, or light mayonnaise and salad dressings (reduced-sodium). Canola, safflower, olive, avocado, soybean, and sunflower oils. Avocado. Seasonings and condiments Herbs. Spices. Seasoning mixes without salt. Other foods Unsalted popcorn and pretzels. Fat-free sweets. The items listed above may not be all the foods and drinks you can have. Talk to a dietitian to learn more. What foods should I avoid? Fruits Canned fruit in a light or heavy syrup. Fried fruit. Fruit in cream or butter sauce. Vegetables Creamed or fried vegetables. Vegetables in a cheese sauce. Regular canned vegetables that are not marked as low-sodium or reduced-sodium. Regular canned tomato sauce and paste that are not marked as low-sodium or reduced-sodium. Regular tomato and vegetable juices that are not marked as low-sodium or reduced-sodium. Rosita Fire. Olives. Grains Baked goods made with fat, such as croissants, muffins, or some breads. Dry pasta or rice meal packs. Meats and other proteins Fatty cuts of meat. Ribs. Fried meat. Tomasa Blase. Bologna, salami, and other precooked or cured meats,  such as sausages or meat loaves, that are not lean and low in sodium. Fat from the back of a pig (fatback). Bratwurst. Salted nuts and seeds. Canned beans with added salt. Canned or smoked fish. Whole eggs or egg yolks. Chicken or [place] with skin. Dairy Whole or 2% milk, cream, and half-and-half. Whole or full-fat cream cheese. Whole-fat or sweetened yogurt. Full-fat cheese. Nondairy creamers. Whipped toppings. Processed cheese and cheese spreads. Fats and oils Butter. Stick margarine. Lard. Shortening. Ghee. Bacon fat. Tropical oils, such as coconut, palm kernel, or palm oil. Seasonings and condiments Onion salt, garlic salt, seasoned salt, table salt, and sea salt. Worcestershire sauce. Tartar sauce.  Barbecue sauce. Teriyaki sauce. Soy sauce, including reduced-sodium soy sauce. Steak sauce. Canned and packaged gravies. Fish sauce. Oyster sauce. Cocktail sauce. Store-bought horseradish. Ketchup. Mustard. Meat flavorings and tenderizers. Bouillon cubes. Hot sauces. Pre-made or packaged marinades. Pre-made or packaged taco seasonings. Relishes. Regular salad dressings. Other foods Salted popcorn and pretzels. The items listed above may not be all the foods and drinks you should avoid. Talk to a dietitian to learn more. Where to find more information National Heart, Lung, and Blood Institute (NHLBI): BuffaloDryCleaner.gl American Heart Association (AHA): heart.org Academy of Nutrition and Dietetics: eatright.org National Kidney Foundation (NKF): kidney.org This information is not intended to replace advice given to you by your health care provider. Make sure you discuss any questions you have with your health care provider. Document Revised: 08/01/2022 Document Reviewed: 08/01/2022 Elsevier Patient Education  2024 [employer].

## 2023-03-28 ENCOUNTER — Ambulatory Visit (INDEPENDENT_AMBULATORY_CARE_PROVIDER_SITE_OTHER): Payer: Medicare PPO | Admitting: Nurse Practitioner

## 2023-03-28 ENCOUNTER — Encounter: Payer: Self-pay | Admitting: Nurse Practitioner

## 2023-03-28 VITALS — BP 133/73 | HR 69 | Temp 98.2°F | Ht 63.8 in | Wt 133.6 lb

## 2023-03-28 DIAGNOSIS — J432 Centrilobular emphysema: Secondary | ICD-10-CM

## 2023-03-28 DIAGNOSIS — Z23 Encounter for immunization: Secondary | ICD-10-CM

## 2023-03-28 DIAGNOSIS — I700 Atherosclerosis of aorta: Secondary | ICD-10-CM

## 2023-03-28 DIAGNOSIS — I10 Essential (primary) hypertension: Secondary | ICD-10-CM | POA: Diagnosis not present

## 2023-03-28 DIAGNOSIS — R911 Solitary pulmonary nodule: Secondary | ICD-10-CM | POA: Diagnosis not present

## 2023-03-28 DIAGNOSIS — J0110 Acute frontal sinusitis, unspecified: Secondary | ICD-10-CM

## 2023-03-28 MED ORDER — AZITHROMYCIN 250 MG PO TABS: ORAL_TABLET | ORAL | 0 refills | Status: AC

## 2023-03-28 MED ORDER — PREDNISONE 20 MG PO TABS: 40.0000 mg | ORAL_TABLET | Freq: Every day | ORAL | 0 refills | Status: AC

## 2023-03-28 NOTE — Assessment & Plan Note (Signed)
Chronic, ongoing in smoker.  Educated her on CT screening from 03/12/23.  No current inhalers.  May benefit from them in future.  Plan on spirometry at next visit.

## 2023-03-28 NOTE — Assessment & Plan Note (Signed)
Noted on CT lung screening 03/12/23 -- is scheduled to recheck in 6 months.

## 2023-03-28 NOTE — Assessment & Plan Note (Signed)
Acute with symptoms starting >5 days ago.  Higher risk.  Will start Prednisone 40 MG daily for 5 days and Zpack.  Educated her on medication to use OTC.  Recommend: - Increased rest - Increasing Fluids - Acetaminophen as needed for fever/pain.  - Salt water gargling, chloraseptic spray and throat lozenges - OTC Coricidin - Mucinex.  - Humidifying the air.

## 2023-03-28 NOTE — Progress Notes (Signed)
BP 133/73   Pulse 69   Temp 98.2 F (36.8 C) (Oral)   Ht 5' 3.8" (1.621 m)   Wt 133 lb 9.6 oz (60.6 kg)   SpO2 98%   BMI 23.08 kg/m    Subjective:    Patient ID: Debra Munoz, female    DOB: 05-23-1956, 67 y.o.   MRN: [id number]  HPI: Debra Munoz is a 67 y.o. female  Chief Complaint  Patient presents with   Hypertension   HYPERTENSION without Chronic Kidney Disease Started on Benazepril 20 MG on 02/13/23 -- tolerating well.  Does have cough from sinus issues. Hypertension status: stable  Satisfied with current treatment? yes Duration of hypertension: chronic BP monitoring frequency:  weekly BP range: 120-130/70 range BP medication side effects:  no Medication compliance: good compliance Aspirin: no Recurrent headaches: no Visual changes: no Palpitations: no Dyspnea: no Chest pain: no Lower extremity edema: no Dizzy/lightheaded: yes with sinus infection  COPD Had initial lung cancer screening on 03/12/23 -- noted moderate centrilobular emphysema and aortic atherosclerosis + noted a 6 mm nodule.  Plan for recheck in 6 months.  Continues to smoke daily.  Started with sinus symptoms 5 days ago.  Congestion, runny nose, postnasal drip, cough (at times productive), sinus pressure and headache all present.  No fever or body aches. COPD status: stable Satisfied with current treatment?: yes Oxygen use: no Dyspnea frequency: no Cough frequency: with sinus infection Rescue inhaler frequency:  none Limitation of activity: no Productive cough: yes Last Spirometry: none Pneumovax: Up to Date Influenza: Up to Date   Relevant past medical, surgical, family and social history reviewed and updated as indicated. Interim medical history since our last visit reviewed. Allergies and medications reviewed and updated.  Review of Systems  Constitutional:  Positive for fatigue. Negative for activity change, appetite change, chills and fever.  HENT:  Positive  for congestion, postnasal drip, rhinorrhea, sinus pressure and sinus pain. Negative for ear discharge, ear pain, sore throat and voice change.   Respiratory:  Positive for cough. Negative for chest tightness, shortness of breath and wheezing.   Cardiovascular:  Negative for chest pain, palpitations and leg swelling.  Neurological:  Positive for headaches.  Psychiatric/Behavioral: Negative.      Per HPI unless specifically indicated above     Objective:    BP 133/73   Pulse 69   Temp 98.2 F (36.8 C) (Oral)   Ht 5' 3.8" (1.621 m)   Wt 133 lb 9.6 oz (60.6 kg)   SpO2 98%   BMI 23.08 kg/m   Wt Readings from Last 3 Encounters:  03/28/23 133 lb 9.6 oz (60.6 kg)  02/13/23 133 lb 9.6 oz (60.6 kg)  01/16/23 133 lb 6.4 oz (60.5 kg)    Physical Exam Vitals and nursing note reviewed.  Constitutional:      General: She is awake. She is not in acute distress.    Appearance: She is well-developed and well-groomed. She is not ill-appearing or toxic-appearing.  HENT:     Head: Normocephalic.     Right Ear: Hearing, ear canal and external ear normal. A middle ear effusion is present. Tympanic membrane is not injected or perforated.     Left Ear: Hearing, ear canal and external ear normal. A middle ear effusion is present. Tympanic membrane is not injected or perforated.     Nose: Rhinorrhea present. Rhinorrhea is clear.     Right Sinus: Frontal sinus tenderness present. No maxillary sinus tenderness.  Left Sinus: Frontal sinus tenderness present. No maxillary sinus tenderness.     Mouth/Throat:     Mouth: Mucous membranes are moist.     Pharynx: Posterior oropharyngeal erythema (cobblestone pattern) present. No pharyngeal swelling or oropharyngeal exudate.  Eyes:     General: Lids are normal.        Right eye: No discharge.        Left eye: No discharge.     Conjunctiva/sclera: Conjunctivae normal.     Pupils: Pupils are equal, round, and reactive to light.  Neck:     Thyroid: No  thyromegaly.     Vascular: No carotid bruit.  Cardiovascular:     Rate and Rhythm: Normal rate and regular rhythm.     Heart sounds: Normal heart sounds. No murmur heard.    No gallop.  Pulmonary:     Effort: Pulmonary effort is normal. No accessory muscle usage or respiratory distress.     Breath sounds: Wheezing present. No decreased breath sounds or rhonchi.     Comments: Scattered expiratory wheezes. Abdominal:     General: Bowel sounds are normal.     Palpations: Abdomen is soft. There is no hepatomegaly or splenomegaly.  Musculoskeletal:     Cervical back: Normal range of motion and neck supple.     Right lower leg: No edema.     Left lower leg: No edema.  Lymphadenopathy:     Head:     Right side of head: No submental, submandibular, tonsillar, preauricular or posterior auricular adenopathy.     Left side of head: No submental, submandibular, tonsillar, preauricular or posterior auricular adenopathy.     Cervical: No cervical adenopathy.  Skin:    General: Skin is warm and dry.  Neurological:     Mental Status: She is alert and oriented to person, place, and time.  Psychiatric:        Attention and Perception: Attention normal.        Mood and Affect: Mood normal.        Speech: Speech normal.        Behavior: Behavior normal. Behavior is cooperative.        Thought Content: Thought content normal.    Results for orders placed or performed in visit on 01/16/23  CBC with Differential/Platelet  Result Value Ref Range   WBC 5.6 3.4 - 10.8 x10E3/uL   RBC 4.57 3.77 - 5.28 x10E6/uL   Hemoglobin 13.2 11.1 - 15.9 g/dL   Hematocrit 16.1 09.6 - 46.6 %   MCV 90 79 - 97 fL   MCH 28.9 26.6 - 33.0 pg   MCHC 32.3 31.5 - 35.7 g/dL   RDW 04.5 40.9 - 81.1 %   Platelets 245 150 - 450 x10E3/uL   Neutrophils 71 Not Estab. %   Lymphs 18 Not Estab. %   Monocytes 8 Not Estab. %   Eos 2 Not Estab. %   Basos 1 Not Estab. %   Neutrophils Absolute 3.9 1.4 - 7.0 x10E3/uL   Lymphocytes  Absolute 1.0 0.7 - 3.1 x10E3/uL   Monocytes Absolute 0.5 0.1 - 0.9 x10E3/uL   EOS (ABSOLUTE) 0.1 0.0 - 0.4 x10E3/uL   Basophils Absolute 0.0 0.0 - 0.2 x10E3/uL   Immature Granulocytes 0 Not Estab. %   Immature Grans (Abs) 0.0 0.0 - 0.1 x10E3/uL  Comprehensive metabolic panel  Result Value Ref Range   Glucose 82 70 - 99 mg/dL   BUN 12 8 - 27 mg/dL   Creatinine, Ser  0.82 0.57 - 1.00 mg/dL   eGFR 77 >00 XF/GHW/2.99   BUN/Creatinine Ratio 15 12 - 28   Sodium 138 134 - 144 mmol/L   Potassium 4.1 3.5 - 5.2 mmol/L   Chloride 101 96 - 106 mmol/L   CO2 24 20 - 29 mmol/L   Calcium 9.2 8.7 - 10.3 mg/dL   Total Protein 6.2 6.0 - 8.5 g/dL   Albumin 4.2 3.9 - 4.9 g/dL   Globulin, Total 2.0 1.5 - 4.5 g/dL   Bilirubin Total 0.3 0.0 - 1.2 mg/dL   Alkaline Phosphatase 90 44 - 121 IU/L   AST 13 0 - 40 IU/L   ALT 9 0 - 32 IU/L  Lipid Panel w/o Chol/HDL Ratio  Result Value Ref Range   Cholesterol, Total 183 100 - 199 mg/dL   Triglycerides 371 0 - 149 mg/dL   HDL 46 >69 mg/dL   VLDL Cholesterol Cal 22 5 - 40 mg/dL   LDL Chol Calc (NIH) 678 (H) 0 - 99 mg/dL  TSH  Result Value Ref Range   TSH 2.030 0.450 - 4.500 uIU/mL  VITAMIN D 25 Hydroxy (Vit-D Deficiency, Fractures)  Result Value Ref Range   Vit D, 25-Hydroxy 27.1 (L) 30.0 - 100.0 ng/mL      Assessment & Plan:   Problem List Items Addressed This Visit       Cardiovascular and Mediastinum   Aortic atherosclerosis (HCC)    Chronic, noted on CT 03/12/23.  Educated patient on this.  Will continue Rosuvastatin daily + recommend a daily Baby ASA 81 MG.      Hypertension    Chronic,ongoing.  BP at goal.  Will continue Benazepril 20 MG daily, which she tolerated in past -- PCP would prefer ARB due to her COPD, but she tolerated ACE better in past.  Continue diet and exercise focus + maintaining weight loss.  Recommend she monitor BP at least a few mornings a week at home and document.  DASH diet at home.  Labs today: up to date.  Recommend cut  back on smoking.  Return in February 2025.         Respiratory   Acute non-recurrent frontal sinusitis    Acute with symptoms starting >5 days ago.  Higher risk.  Will start Prednisone 40 MG daily for 5 days and Zpack.  Educated her on medication to use OTC.  Recommend: - Increased rest - Increasing Fluids - Acetaminophen as needed for fever/pain.  - Salt water gargling, chloraseptic spray and throat lozenges - OTC Coricidin - Mucinex.  - Humidifying the air.       Relevant Medications   predniSONE (DELTASONE) 20 MG tablet   azithromycin (ZITHROMAX) 250 MG tablet   Centrilobular emphysema (HCC) - Primary    Chronic, ongoing in smoker.  Educated her on CT screening from 03/12/23.  No current inhalers.  May benefit from them in future.  Plan on spirometry at next visit.      Relevant Medications   predniSONE (DELTASONE) 20 MG tablet   azithromycin (ZITHROMAX) 250 MG tablet   Right lower lobe pulmonary nodule    Noted on CT lung screening 03/12/23 -- is scheduled to recheck in 6 months.      Other Visit Diagnoses     Need for influenza vaccination       High dose flu vaccine today   Relevant Orders   Flu Vaccine Trivalent High Dose (Fluad) (Completed)        Follow up plan:  Return in about 6 months (around 09/16/2023) for HTN/HLD, COPD.

## 2023-03-28 NOTE — Assessment & Plan Note (Signed)
Chronic, noted on CT 03/12/23.  Educated patient on this.  Will continue Rosuvastatin daily + recommend a daily Baby ASA 81 MG.

## 2023-03-28 NOTE — Assessment & Plan Note (Signed)
Chronic,ongoing.  BP at goal.  Will continue Benazepril 20 MG daily, which she tolerated in past -- PCP would prefer ARB due to her COPD, but she tolerated ACE better in past.  Continue diet and exercise focus + maintaining weight loss.  Recommend she monitor BP at least a few mornings a week at home and document.  DASH diet at home.  Labs today: up to date.  Recommend cut back on smoking.  Return in February 2025.

## 2023-04-14 ENCOUNTER — Other Ambulatory Visit: Payer: Self-pay | Admitting: Nurse Practitioner

## 2023-04-14 ENCOUNTER — Ambulatory Visit: Payer: Self-pay

## 2023-04-14 ENCOUNTER — Ambulatory Visit (INDEPENDENT_AMBULATORY_CARE_PROVIDER_SITE_OTHER): Payer: Medicare PPO | Admitting: Family Medicine

## 2023-04-14 VITALS — BP 124/74 | HR 71 | Temp 98.2°F | Ht 64.76 in | Wt 130.4 lb

## 2023-04-14 DIAGNOSIS — R051 Acute cough: Secondary | ICD-10-CM | POA: Diagnosis not present

## 2023-04-14 MED ORDER — AMOXICILLIN-POT CLAVULANATE 875-125 MG PO TABS: 1.0000 | ORAL_TABLET | Freq: Two times a day (BID) | ORAL | 0 refills | Status: AC

## 2023-04-14 MED ORDER — HYDROCOD POLI-CHLORPHE POLI ER 10-8 MG/5ML PO SUER: 5.0000 mL | Freq: Every evening | ORAL | 0 refills | Status: DC | PRN

## 2023-04-14 MED ORDER — ALBUTEROL SULFATE HFA 108 (90 BASE) MCG/ACT IN AERS: 2.0000 | INHALATION_SPRAY | Freq: Four times a day (QID) | RESPIRATORY_TRACT | 0 refills | Status: DC | PRN

## 2023-04-14 NOTE — Progress Notes (Signed)
BP 124/74   Pulse 71   Temp 98.2 F (36.8 C) (Oral)   Ht 5' 4.76" (1.645 m)   Wt 130 lb 6.4 oz (59.1 kg)   SpO2 90%   BMI 21.86 kg/m    Subjective:    Patient ID: Debra Munoz, female    DOB: [birth year], 67 y.o.   MRN: [id number]  HPI: Debra Munoz is a 67 y.o. female  Chief Complaint  Patient presents with   Cough    Feels cough is worsened after being treated for sinusitis with antibiotics and steroids   COUGH She was previously seen 03/28/2023 and given z pack and Prednisone 40 MG for 5 days for sinusitis. She has history of centrilobular emphysema, current tobacco user. Today she complains that her cough is not better. She is complaining of wheezing, sob with exertion, cough, and chest tightness. She feels she has not gotten better and describes her worst symptom is cough and chest congestion. O2 today is 90%.   Duration: 3 weeks Circumstances of initial development of cough: unknown Cough severity: moderate Cough description: productive, wet, and irritating Aggravating factors:  nothing Alleviating factors: nothing Status:  stable but no better Treatments attempted: mucinex and cough syrup Wheezing: yes Shortness of breath: yes Chest pain: no Chest tightness:yes Nasal congestion: yes Runny nose: yes Postnasal drip: yes Frequent throat clearing or swallowing: no Hemoptysis: no Fevers: no Night sweats: No Weight loss: no Heartburn: yes within the last week  Recent foreign travel: no Tuberculosis contacts: no   Relevant past medical, surgical, family and social history reviewed and updated as indicated. Interim medical history since our last visit reviewed. Allergies and medications reviewed and updated.  Review of Systems  Constitutional:  Negative for fever.  HENT:  Positive for congestion. Negative for postnasal drip, rhinorrhea, sinus pressure, sinus pain, sneezing and sore throat.   Respiratory:  Positive for cough, chest tightness  and shortness of breath (w/ exertion). Negative for wheezing.   Cardiovascular: Negative.    Per HPI unless specifically indicated above     Objective:    BP 124/74   Pulse 71   Temp 98.2 F (36.8 C) (Oral)   Ht 5' 4.76" (1.645 m)   Wt 130 lb 6.4 oz (59.1 kg)   SpO2 90%   BMI 21.86 kg/m   Wt Readings from Last 3 Encounters:  04/14/23 130 lb 6.4 oz (59.1 kg)  03/28/23 133 lb 9.6 oz (60.6 kg)  02/13/23 133 lb 9.6 oz (60.6 kg)    Physical Exam Vitals and nursing note reviewed.  Constitutional:      General: She is awake. She is not in acute distress.    Appearance: Normal appearance. She is well-developed and well-groomed. She is not ill-appearing.  HENT:     Head: Normocephalic and atraumatic.     Right Ear: Hearing and external ear normal. No drainage. Tympanic membrane is not erythematous.     Left Ear: Hearing and external ear normal. No drainage. Tympanic membrane is not erythematous.     Nose: Mucosal edema and congestion present. No rhinorrhea.     Right Turbinates: Not swollen or pale.     Left Turbinates: Not swollen or pale.     Right Sinus: No maxillary sinus tenderness or frontal sinus tenderness.     Left Sinus: No maxillary sinus tenderness or frontal sinus tenderness.     Mouth/Throat:     Pharynx: Postnasal drip present. No posterior oropharyngeal erythema.  Eyes:  General: Lids are normal.        Right eye: No discharge.        Left eye: No discharge.     Conjunctiva/sclera: Conjunctivae normal.  Cardiovascular:     Rate and Rhythm: Normal rate and regular rhythm.     Pulses:          Radial pulses are 2+ on the right side and 2+ on the left side.       Posterior tibial pulses are 2+ on the right side and 2+ on the left side.     Heart sounds: Normal heart sounds, S1 normal and S2 normal. No murmur heard.    No gallop.  Pulmonary:     Effort: Pulmonary effort is normal. No accessory muscle usage or respiratory distress.     Breath sounds: Normal  breath sounds. No decreased breath sounds, wheezing, rhonchi or rales.  Musculoskeletal:        General: Normal range of motion.     Cervical back: Full passive range of motion without pain and normal range of motion.     Right lower leg: No edema.     Left lower leg: No edema.  Skin:    General: Skin is warm and dry.     Capillary Refill: Capillary refill takes less than 2 seconds.  Neurological:     Mental Status: She is alert and oriented to person, place, and time.  Psychiatric:        Attention and Perception: Attention normal.        Mood and Affect: Mood normal.        Speech: Speech normal.        Behavior: Behavior normal. Behavior is cooperative.        Thought Content: Thought content normal.     Results for orders placed or performed in visit on 01/16/23  CBC with Differential/Platelet  Result Value Ref Range   WBC 5.6 3.4 - 10.8 x10E3/uL   RBC 4.57 3.77 - 5.28 x10E6/uL   Hemoglobin 13.2 11.1 - 15.9 g/dL   Hematocrit 16.1 09.6 - 46.6 %   MCV 90 79 - 97 fL   MCH 28.9 26.6 - 33.0 pg   MCHC 32.3 31.5 - 35.7 g/dL   RDW 04.5 40.9 - 81.1 %   Platelets 245 150 - 450 x10E3/uL   Neutrophils 71 Not Estab. %   Lymphs 18 Not Estab. %   Monocytes 8 Not Estab. %   Eos 2 Not Estab. %   Basos 1 Not Estab. %   Neutrophils Absolute 3.9 1.4 - 7.0 x10E3/uL   Lymphocytes Absolute 1.0 0.7 - 3.1 x10E3/uL   Monocytes Absolute 0.5 0.1 - 0.9 x10E3/uL   EOS (ABSOLUTE) 0.1 0.0 - 0.4 x10E3/uL   Basophils Absolute 0.0 0.0 - 0.2 x10E3/uL   Immature Granulocytes 0 Not Estab. %   Immature Grans (Abs) 0.0 0.0 - 0.1 x10E3/uL  Comprehensive metabolic panel  Result Value Ref Range   Glucose 82 70 - 99 mg/dL   BUN 12 8 - 27 mg/dL   Creatinine, Ser 9.14 0.57 - 1.00 mg/dL   eGFR 77 >78 GN/FAO/1.30   BUN/Creatinine Ratio 15 12 - 28   Sodium 138 134 - 144 mmol/L   Potassium 4.1 3.5 - 5.2 mmol/L   Chloride 101 96 - 106 mmol/L   CO2 24 20 - 29 mmol/L   Calcium 9.2 8.7 - 10.3 mg/dL   Total  Protein 6.2 6.0 - 8.5 g/dL  Albumin 4.2 3.9 - 4.9 g/dL   Globulin, Total 2.0 1.5 - 4.5 g/dL   Bilirubin Total 0.3 0.0 - 1.2 mg/dL   Alkaline Phosphatase 90 44 - 121 IU/L   AST 13 0 - 40 IU/L   ALT 9 0 - 32 IU/L  Lipid Panel w/o Chol/HDL Ratio  Result Value Ref Range   Cholesterol, Total 183 100 - 199 mg/dL   Triglycerides 604 0 - 149 mg/dL   HDL 46 >54 mg/dL   VLDL Cholesterol Cal 22 5 - 40 mg/dL   LDL Chol Calc (NIH) 098 (H) 0 - 99 mg/dL  TSH  Result Value Ref Range   TSH 2.030 0.450 - 4.500 uIU/mL  VITAMIN D 25 Hydroxy (Vit-D Deficiency, Fractures)  Result Value Ref Range   Vit D, 25-Hydroxy 27.1 (L) 30.0 - 100.0 ng/mL      Assessment & Plan:   Problem List Items Addressed This Visit     Acute cough - Primary    Acute, ongoing. STAT chest xray ordered to rule out pneumonia. Will treat with 7 day course of Augmentin. PRN Albuterol given for sob. Tussionex PRN given to help with cough. Return in 3 weeks for follow up. Call sooner if concerns arise.       Relevant Orders   DG Chest 2 View     Follow up plan: Return in about 3 weeks (around 05/05/2023) for Follow up sob.

## 2023-04-14 NOTE — Telephone Encounter (Signed)
Chief Complaint: Cough not better, seen on 03/28/23  Symptoms: Cough, chest tightness, low fever w/coughing, little wheezing, little SOB w/exertion Frequency: Cough since 8/29 Pertinent Negatives: Patient denies chest pain at this time, SOB at rest Disposition: [] ED /[] Urgent Care (no appt availability in office) / [x] Appointment(In office/virtual)/ []  [facility]/ [] Home Care/ [] Refused Recommended Disposition /[] [place] [facility]/ []  Follow-up with PCP Additional Notes: Patient says she was seen by Jolene on 03/28/23 and prescribed antibiotic and prednisone, course completed. Patient says she's still no better, cough is the same, chest tightness w/coughing spells, just not feeling good. Advised no availability with PCP until tomorrow, she would like to be seen today if possible. Scheduled with Rashelle today.    Reason for Disposition  [1] MILD difficulty breathing (e.g., minimal/no SOB at rest, SOB with walking, pulse <100) AND [2] still present when not coughing  Answer Assessment - Initial Assessment Questions 1. ONSET: "When did the cough begin?"      3 weeks ago, seen 02/3023 2. SEVERITY: "How bad is the cough today?"      Comes and goes 3. SPUTUM: "Describe the color of your sputum" (none, dry cough; clear, white, yellow, green)     Clear, thick 4. HEMOPTYSIS: "Are you coughing up any blood?" If so ask: "How much?" (flecks, streaks, tablespoons, etc.)     N/A 5. DIFFICULTY BREATHING: "Are you having difficulty breathing?" If Yes, ask: "How bad is it?" (e.g., mild, moderate, severe)    - MILD: No SOB at rest, mild SOB with walking, speaks normally in sentences, can lie down, no retractions, pulse < 100.    - MODERATE: SOB at rest, SOB with minimal exertion and prefers to sit, cannot lie down flat, speaks in phrases, mild retractions, audible wheezing, pulse 100-120.    - SEVERE: Very SOB at rest, speaks in single words, struggling to breathe, sitting hunched forward,  retractions, pulse > 120      A little SOB when I have a coughing spell or exertion, chest tightness with coughing 6. FEVER: "Do you have a fever?" If Yes, ask: "What is your temperature, how was it measured, and when did it start?"     Not checked, but feels low greade 7. CARDIAC HISTORY: "Do you have any history of heart disease?" (e.g., heart attack, congestive heart failure)      No 8. LUNG HISTORY: "Do you have any history of lung disease?"  (e.g., pulmonary embolus, asthma, emphysema)     COPD, mild emphysema 9. PE RISK FACTORS: "Do you have a history of blood clots?" (or: recent major surgery, recent prolonged travel, bedridden)     No 10. OTHER SYMPTOMS: "Do you have any other symptoms?" (e.g., runny nose, wheezing, chest pain)       Wheezing at times, chest tightness with coughing that's worse  Protocols used: Cough - Acute Non-Productive-A-AH

## 2023-04-14 NOTE — Assessment & Plan Note (Addendum)
Acute, ongoing. STAT chest xray ordered to rule out pneumonia. Will treat with 7 day course of Augmentin. PRN Albuterol given for sob. Tussionex PRN given to help with cough. Return in 3 weeks for follow up. Call sooner if concerns arise.

## 2023-04-15 ENCOUNTER — Ambulatory Visit
Admission: RE | Admit: 2023-04-15 | Discharge: 2023-04-15 | Disposition: A | Discharge status: Home / Self Care | Payer: Medicare PPO | Attending: Family Medicine | Admitting: Family Medicine

## 2023-04-15 ENCOUNTER — Ambulatory Visit
Admission: RE | Admit: 2023-04-15 | Discharge: 2023-04-15 | Disposition: A | Discharge status: Home / Self Care | Payer: Medicare PPO | Source: Ambulatory Visit | Attending: Family Medicine | Admitting: Family Medicine

## 2023-04-15 DIAGNOSIS — R051 Acute cough: Secondary | ICD-10-CM | POA: Diagnosis not present

## 2023-04-15 DIAGNOSIS — R0789 Other chest pain: Secondary | ICD-10-CM | POA: Diagnosis not present

## 2023-04-15 DIAGNOSIS — R059 Cough, unspecified: Secondary | ICD-10-CM | POA: Diagnosis not present

## 2023-04-15 DIAGNOSIS — R0602 Shortness of breath: Secondary | ICD-10-CM | POA: Diagnosis not present

## 2023-04-15 NOTE — Progress Notes (Signed)
Hi Aubrynn, your chest xray was normal. There was no pneumonia seen. Thank you for allowing me to participate in your care.

## 2023-04-16 NOTE — Telephone Encounter (Signed)
Requested Prescriptions  Pending Prescriptions Disp Refills   rosuvastatin (CRESTOR) 10 MG tablet [Pharmacy Med Name: ROSUVASTATIN CALCIUM 10 MG TAB] 90 tablet 2    Sig: TAKE 1 TABLET BY MOUTH EVERY DAY     Cardiovascular:  Antilipid - Statins 2 Failed - 04/14/2023  5:24 PM      Failed - Lipid Panel in normal range within the last 12 months    Cholesterol, Total  Date Value Ref Range Status  01/16/2023 183 100 - 199 mg/dL Final   LDL Chol Calc (NIH)  Date Value Ref Range Status  01/16/2023 115 (H) 0 - 99 mg/dL Final   HDL  Date Value Ref Range Status  01/16/2023 46 >39 mg/dL Final   Triglycerides  Date Value Ref Range Status  01/16/2023 125 0 - 149 mg/dL Final         Passed - Cr in normal range and within 360 days    Creatinine, Ser  Date Value Ref Range Status  01/16/2023 0.82 0.57 - 1.00 mg/dL Final         Passed - Patient is not pregnant      Passed - Valid encounter within last 12 months    Recent Outpatient Visits           2 days ago Acute cough   [place] [facility] [place], Sherran Needs, NP   2 weeks ago Centrilobular emphysema (HCC)   [place] [facility] [place], [place] T, NP   2 months ago Primary hypertension   [place] [facility] [place], [place] T, NP   3 months ago Medicare annual wellness visit, subsequent   [place] [facility] [place], [place] T, NP   4 months ago Major depressive disorder with single episode, in partial remission (HCC)   [place] [facility] [place], Corrie Dandy T, NP       Future Appointments             In 2 weeks [place], Dorie Rank, NP [place] [facility]   In 5 months [place], [place] T, NP [place] [facility]             valsartan (DIOVAN) 40 MG tablet [Pharmacy Med Name: VALSARTAN 40 MG TABLET] 90 tablet 1    Sig: TAKE 1 TABLET BY MOUTH EVERY DAY     Cardiovascular:  Angiotensin Receptor  Blockers Passed - 04/14/2023  5:24 PM      Passed - Cr in normal range and within 180 days    Creatinine, Ser  Date Value Ref Range Status  01/16/2023 0.82 0.57 - 1.00 mg/dL Final         Passed - K in normal range and within 180 days    Potassium  Date Value Ref Range Status  01/16/2023 4.1 3.5 - 5.2 mmol/L Final         Passed - Patient is not pregnant      Passed - Last BP in normal range    BP Readings from Last 1 Encounters:  04/14/23 124/74         Passed - Valid encounter within last 6 months    Recent Outpatient Visits           2 days ago Acute cough   [place] [facility] [place], Sherran Needs, NP   2 weeks ago Centrilobular emphysema [facility])    [facility] [place], Dorie Rank, NP  2 months ago Primary hypertension   [place] [facility] [place], [place] T, NP   3 months ago Medicare annual wellness visit, subsequent   [place] [facility] [place], [place] T, NP   4 months 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

## 2023-04-23 ENCOUNTER — Ambulatory Visit
Admission: RE | Admit: 2023-04-23 | Discharge: 2023-04-23 | Disposition: A | Discharge status: Home / Self Care | Payer: Medicare PPO | Source: Ambulatory Visit | Attending: Nurse Practitioner | Admitting: Nurse Practitioner

## 2023-04-23 DIAGNOSIS — Z1231 Encounter for screening mammogram for malignant neoplasm of breast: Secondary | ICD-10-CM | POA: Diagnosis not present

## 2023-04-24 NOTE — Progress Notes (Signed)
Contacted via MyChart   Normal mammogram, may repeat in one year:)

## 2023-05-03 NOTE — Patient Instructions (Signed)
 Be Involved in Caring For Your Health:  Taking Medications When medications are taken as directed, they can greatly improve your health. But if they are not taken as prescribed, they may not work. In some cases, not taking them correctly can be harmful. To help ensure your treatment remains effective and safe, understand your medications and how to take them. Bring your medications to each visit for review by your provider.  Your lab results, notes, and after visit summary will be available on My Chart. We strongly encourage you to use this feature. If lab results are abnormal the clinic will contact you with the appropriate steps. If the clinic does not contact you assume the results are satisfactory. You can always view your results on My Chart. If you have questions regarding your health or results, please contact the clinic during office hours. You can also ask questions on My Chart.  We at [facility] are grateful that you chose [place] to provide your care. We strive to provide evidence-based and compassionate care and are always looking for feedback. If you get a survey from the clinic please complete this so we can hear your opinions.  Eating Plan for Chronic Obstructive Pulmonary Disease Chronic obstructive pulmonary disease (COPD) causes symptoms such as shortness of breath, coughing, and chest discomfort. These symptoms can make it difficult to eat enough to maintain a healthy weight. Generally, people with COPD should eat a diet that is high in calories, protein, and other nutrients to maintain body weight and to keep the lungs as healthy as possible. Depending on the medicines you take and other health conditions you may have, your health care provider may give you additional recommendations on what to eat or avoid. Talk with your health care provider about your goals for body weight, and work with a dietitian to develop an eating plan that is right for you. What are tips for  following this plan? Reading food labels  Avoid foods with more than 300 milligrams (mg) of salt (sodium) per serving. Choose foods that contain at least 4 grams (g) of fiber per serving. Try to eat 20-30 g of fiber each day. Choose foods that are high in calories and protein, such as nuts, beans, yogurt, and cheese. Shopping Do not buy foods labeled as diet, low-calorie, or low-fat. If you are able to eat dairy products: Avoid low-fat or skim milk. Buy dairy products that have at least 2% fat. Buy nutritional supplement drinks. Buy grains and prepared foods labeled as enriched or fortified. Consider buying low-sodium, pre-made foods to conserve energy for eating. Cooking Add dry milk or protein powder to smoothies. Cook with healthy fats, such as olive oil, canola oil, sunflower oil, and grapeseed oil. Add oil, butter, cream cheese, or nut butters to foods to increase fat and calories. To make foods easier to chew and swallow: Cook vegetables, pasta, and rice until soft. Cut or grind meat into very small pieces. Dip breads in liquid. Meal planning  Eat when you feel hungry. Eat 5-6 small meals throughout the day. Drink 6-8 glasses of water each day. Do not drink liquids with meals. Drink liquids at the end of the meal to avoid feeling full too quickly. Eat a variety of fruits and vegetables every day. Ask for assistance from family or friends with planning and preparing meals as needed. Avoid foods that cause you to feel bloated, such as carbonated drinks, fried foods, beans, broccoli, cabbage, and apples. For older adults, ask your local  agency on aging whether you are eligible for meal assistance programs, such as Meals on Wheels. Lifestyle  Do not smoke. Eat slowly. Take small bites and chew food well before swallowing. Do not overeat. This may make it more difficult to breathe after eating. Sit up while eating. If needed, continue to use supplemental oxygen while  eating. Rest or relax for 30 minutes before and after eating. Monitor your weight as told by your health care provider. Exercise as told by your health care provider. What foods should I eat? Fruits All fresh, dried, canned, or frozen fruits that do not cause gas. Vegetables All fresh, canned (no salt added), or frozen vegetables that do not cause gas. Grains Whole-grain bread. Enriched whole-grain pasta. Fortified whole-grain cereals. Fortified rice. Quinoa. Meats and other proteins Lean meat. Poultry. Fish. Dried beans. Unsalted nuts. Tofu. Eggs. Nut butters. Dairy Whole or 2% milk. Cheese. Yogurt. Fats and oils Olive oil. Canola oil. Butter. Margarine. Beverages Water. Vegetable juice (no salt added). Decaffeinated coffee. Decaffeinated or herbal tea. Seasonings and condiments Fresh or dried herbs. Low-salt or salt-free seasonings. Low-sodium soy sauce. The items listed above may not be a complete list of foods and beverages you can eat. Contact a dietitian for more information. What foods should I avoid? Fruits Fruits that cause gas, such as apples or melon. Vegetables Vegetables that cause gas, such as broccoli, Brussels sprouts, cabbage, cauliflower, and onions. Canned vegetables with added salt. Meats and other proteins Fried meat. Salt-cured meat. Processed meat. Dairy Fat-free or low-fat milk, yogurt, or cheese. Processed cheese. Beverages Carbonated drinks. Caffeinated drinks, such as coffee, tea, and soft drinks. Juice. Alcohol. Vegetable juice with added salt. Seasonings and condiments Salt. Seasoning mixes with salt. Soy sauce. Rosita Fire. Other foods Clear soup or broth. Fried foods. Prepared frozen meals. The items listed above may not be a complete list of foods and beverages you should avoid. Contact a dietitian for more information. Summary COPD symptoms can make it difficult to eat enough to maintain a healthy weight. A COPD eating plan can help you maintain  your body weight and keep your lungs as healthy as possible. Eat a diet that is high in calories, protein, and other nutrients. Read labels to make sure that you are getting the right nutrients. Cook foods to make them easier to chew and swallow. Eat 5-6 small meals throughout the day, and avoid foods that cause gas or make you feel bloated. This information is not intended to replace advice given to you by your health care provider. Make sure you discuss any questions you have with your health care provider. Document Revised: 05/23/2020 Document Reviewed: 05/23/2020 Elsevier Patient Education  2024 [employer].

## 2023-05-05 ENCOUNTER — Telehealth: Payer: Self-pay

## 2023-05-05 ENCOUNTER — Encounter: Payer: Self-pay | Admitting: Nurse Practitioner

## 2023-05-05 ENCOUNTER — Ambulatory Visit (INDEPENDENT_AMBULATORY_CARE_PROVIDER_SITE_OTHER): Payer: Medicare PPO | Admitting: Nurse Practitioner

## 2023-05-05 VITALS — BP 133/69 | HR 63 | Temp 98.3°F | Wt 131.4 lb

## 2023-05-05 DIAGNOSIS — M549 Dorsalgia, unspecified: Secondary | ICD-10-CM | POA: Insufficient documentation

## 2023-05-05 DIAGNOSIS — M545 Low back pain, unspecified: Secondary | ICD-10-CM

## 2023-05-05 DIAGNOSIS — R051 Acute cough: Secondary | ICD-10-CM

## 2023-05-05 DIAGNOSIS — J432 Centrilobular emphysema: Secondary | ICD-10-CM | POA: Diagnosis not present

## 2023-05-05 MED ORDER — LIDOCAINE 5 % EX PTCH: 1.0000 | MEDICATED_PATCH | CUTANEOUS | 0 refills | Status: DC

## 2023-05-05 MED ORDER — MONTELUKAST SODIUM 10 MG PO TABS: 10.0000 mg | ORAL_TABLET | Freq: Every day | ORAL | 3 refills | Status: DC

## 2023-05-05 MED ORDER — TIZANIDINE HCL 4 MG PO TABS: 4.0000 mg | ORAL_TABLET | Freq: Four times a day (QID) | ORAL | 1 refills | Status: DC | PRN

## 2023-05-05 NOTE — Assessment & Plan Note (Addendum)
Chronic, ongoing in smoker.  Educated her on CT screening from 03/12/23.  No current inhalers.  May benefit from them in future.  Spirometry overall reassuring with normal levels on 05/05/23, will repeat annually.  Recent cough has improved, but still noticing issues while outside.  Recommend she use Albuterol inhaler prior to working outside + will send in Singulair to trial and see if benefit to allergies.  Educated her on medication and BLACK BOX warning.

## 2023-05-05 NOTE — Progress Notes (Signed)
BP 133/69   Pulse 63   Temp 98.3 F (36.8 C) (Oral)   Wt 131 lb 6.4 oz (59.6 kg)   SpO2 93%   BMI 22.03 kg/m    Subjective:    Patient ID: Debra Munoz, female    DOB: [birth year], 67 y.o.   MRN: [id number]  HPI: Debra Munoz is a 67 y.o. female  Chief Complaint  Patient presents with   Shortness of Breath    3 week f/up   COPD Was treated for an exacerbation on 04/14/23, treated with Augmentin.  This improved, but over weekend she was out in yard working and cough returned somewhat.  Had lung screening on 03/12/23. COPD status: stable Satisfied with current treatment?: yes Oxygen use: no Dyspnea frequency: no Cough frequency: a little bit when outside Rescue inhaler frequency:  two times a month Limitation of activity: no Productive cough: none Last Spirometry: 05/05/23 Pneumovax: Up to Date Influenza: Up to Date   BACK PAIN Has had back pain for two weeks.  No recent injuries. Duration: weeks Mechanism of injury: unknown Location: midline, bilateral, and low back Onset: gradual Severity: 6/10 Quality: aching, ill-defined, and throbbing Frequency: intermittent Radiation: none Aggravating factors: lifting, movement, walking, and bending Alleviating factors: nothing Status: fluctuating Treatments attempted: heating pad, Ibuprofen, Tylenol, Icy/Hot, had prednisone with recent cough and this did not help. Relief with NSAIDs?: no Nighttime pain:   when first lies down it aches Paresthesias / decreased sensation:  no Bowel / bladder incontinence:  no Fevers:  no Dysuria / urinary frequency:  no   Relevant past medical, surgical, family and social history reviewed and updated as indicated. Interim medical history since our last visit reviewed. Allergies and medications reviewed and updated.  Review of Systems  Constitutional:  Negative for activity change, appetite change, diaphoresis, fatigue and fever.  Respiratory:  Positive for cough  (mild since working in yard). Negative for chest tightness, shortness of breath and wheezing.   Cardiovascular:  Negative for chest pain, palpitations and leg swelling.  Musculoskeletal:  Positive for back pain.  Neurological: Negative.   Psychiatric/Behavioral: Negative.      Per HPI unless specifically indicated above     Objective:    BP 133/69   Pulse 63   Temp 98.3 F (36.8 C) (Oral)   Wt 131 lb 6.4 oz (59.6 kg)   SpO2 93%   BMI 22.03 kg/m   Wt Readings from Last 3 Encounters:  05/05/23 131 lb 6.4 oz (59.6 kg)  04/14/23 130 lb 6.4 oz (59.1 kg)  03/28/23 133 lb 9.6 oz (60.6 kg)    Physical Exam Vitals and nursing note reviewed.  Constitutional:      General: She is awake. She is not in acute distress.    Appearance: She is well-developed and well-groomed. She is not ill-appearing or toxic-appearing.  HENT:     Head: Normocephalic.     Right Ear: Hearing and external ear normal.     Left Ear: Hearing and external ear normal.  Eyes:     General: Lids are normal.        Right eye: No discharge.        Left eye: No discharge.     Conjunctiva/sclera: Conjunctivae normal.     Pupils: Pupils are equal, round, and reactive to light.  Neck:     Thyroid: No thyromegaly.     Vascular: No carotid bruit.  Cardiovascular:     Rate and Rhythm: Normal rate and  regular rhythm.     Heart sounds: Murmur heard.     Systolic murmur is present with a grade of 2/6.     No gallop.  Pulmonary:     Effort: Pulmonary effort is normal. No accessory muscle usage or respiratory distress.     Breath sounds: Normal breath sounds. No decreased breath sounds, wheezing or rhonchi.  Abdominal:     General: Bowel sounds are normal. There is no distension.     Palpations: Abdomen is soft.     Tenderness: There is no abdominal tenderness.  Musculoskeletal:     Cervical back: Normal range of motion and neck supple.     Lumbar back: Tenderness present. No swelling, spasms or bony tenderness.  Decreased range of motion. Negative right straight leg raise test and negative left straight leg raise test.     Right lower leg: No edema.     Left lower leg: No edema.     Comments: Decreased extension and lateral + rotation.  Mild tenderness to lower mid back and SI joints.  No rashes.  Lymphadenopathy:     Cervical: No cervical adenopathy.  Skin:    General: Skin is warm and dry.  Neurological:     Mental Status: She is alert and oriented to person, place, and time.     Deep Tendon Reflexes: Reflexes are normal and symmetric.     Reflex Scores:      Brachioradialis reflexes are 2+ on the right side and 2+ on the left side.      Patellar reflexes are 2+ on the right side and 2+ on the left side. Psychiatric:        Attention and Perception: Attention normal.        Mood and Affect: Mood normal.        Speech: Speech normal.        Behavior: Behavior normal. Behavior is cooperative.        Thought Content: Thought content normal.     Results for orders placed or performed in visit on 01/16/23  CBC with Differential/Platelet  Result Value Ref Range   WBC 5.6 3.4 - 10.8 x10E3/uL   RBC 4.57 3.77 - 5.28 x10E6/uL   Hemoglobin 13.2 11.1 - 15.9 g/dL   Hematocrit 40.9 81.1 - 46.6 %   MCV 90 79 - 97 fL   MCH 28.9 26.6 - 33.0 pg   MCHC 32.3 31.5 - 35.7 g/dL   RDW 91.4 78.2 - 95.6 %   Platelets 245 150 - 450 x10E3/uL   Neutrophils 71 Not Estab. %   Lymphs 18 Not Estab. %   Monocytes 8 Not Estab. %   Eos 2 Not Estab. %   Basos 1 Not Estab. %   Neutrophils Absolute 3.9 1.4 - 7.0 x10E3/uL   Lymphocytes Absolute 1.0 0.7 - 3.1 x10E3/uL   Monocytes Absolute 0.5 0.1 - 0.9 x10E3/uL   EOS (ABSOLUTE) 0.1 0.0 - 0.4 x10E3/uL   Basophils Absolute 0.0 0.0 - 0.2 x10E3/uL   Immature Granulocytes 0 Not Estab. %   Immature Grans (Abs) 0.0 0.0 - 0.1 x10E3/uL  Comprehensive metabolic panel  Result Value Ref Range   Glucose 82 70 - 99 mg/dL   BUN 12 8 - 27 mg/dL   Creatinine, Ser 2.13 0.57 -  1.00 mg/dL   eGFR 77 >08 MV/HQI/6.96   BUN/Creatinine Ratio 15 12 - 28   Sodium 138 134 - 144 mmol/L   Potassium 4.1 3.5 - 5.2 mmol/L  Chloride 101 96 - 106 mmol/L   CO2 24 20 - 29 mmol/L   Calcium 9.2 8.7 - 10.3 mg/dL   Total Protein 6.2 6.0 - 8.5 g/dL   Albumin 4.2 3.9 - 4.9 g/dL   Globulin, Total 2.0 1.5 - 4.5 g/dL   Bilirubin Total 0.3 0.0 - 1.2 mg/dL   Alkaline Phosphatase 90 44 - 121 IU/L   AST 13 0 - 40 IU/L   ALT 9 0 - 32 IU/L  Lipid Panel w/o Chol/HDL Ratio  Result Value Ref Range   Cholesterol, Total 183 100 - 199 mg/dL   Triglycerides 578 0 - 149 mg/dL   HDL 46 >46 mg/dL   VLDL Cholesterol Cal 22 5 - 40 mg/dL   LDL Chol Calc (NIH) 962 (H) 0 - 99 mg/dL  TSH  Result Value Ref Range   TSH 2.030 0.450 - 4.500 uIU/mL  VITAMIN D 25 Hydroxy (Vit-D Deficiency, Fractures)  Result Value Ref Range   Vit D, 25-Hydroxy 27.1 (L) 30.0 - 100.0 ng/mL      Assessment & Plan:   Problem List Items Addressed This Visit       Respiratory   Centrilobular emphysema (HCC) - Primary    Chronic, ongoing in smoker.  Educated her on CT screening from 03/12/23.  No current inhalers.  May benefit from them in future.  Spirometry overall reassuring with normal levels on 05/05/23, will repeat annually.  Recent cough has improved, but still noticing issues while outside.  Recommend she use Albuterol inhaler prior to working outside + will send in Singulair to trial and see if benefit to allergies.  Educated her on medication and BLACK BOX warning.      Relevant Medications   montelukast (SINGULAIR) 10 MG tablet   Other Relevant Orders   Spirometry with Graph (Completed)     Other   Back pain    Acute for two weeks, ?arthritic changes. Recommend she continue OTC medications at home.  Recently had Prednisone for illness and this did not help back.  Will send in Tizanidine to take as needed, educated her on this and not to take while driving or working.  Lidocaine patches sent to use daily -- on  in morning and off before bed.  Educated her on this.  Return in 2 weeks and if ongoing will consider imaging and possible PT.      Relevant Medications   tiZANidine (ZANAFLEX) 4 MG tablet     Follow up plan: Return in about 2 weeks (around 05/19/2023) for BACK PAIN.

## 2023-05-05 NOTE — Telephone Encounter (Signed)
PA for Lidocaine patches initiated and submitted via Cover My Meds. Key: [id number]

## 2023-05-05 NOTE — Assessment & Plan Note (Signed)
Acute for two weeks, ?arthritic changes. Recommend she continue OTC medications at home.  Recently had Prednisone for illness and this did not help back.  Will send in Tizanidine to take as needed, educated her on this and not to take while driving or working.  Lidocaine patches sent to use daily -- on in morning and off before bed.  Educated her on this.  Return in 2 weeks and if ongoing will consider imaging and possible PT.

## 2023-05-18 NOTE — Patient Instructions (Signed)
Acute Back Pain, Adult Acute back pain is sudden and usually short-lived. It is often caused by an injury to the muscles and tissues in the back. The injury may result from: A muscle, tendon, or ligament getting overstretched or torn. Ligaments are tissues that connect bones to each other. Lifting something improperly can cause a back strain. Wear and tear (degeneration) of the spinal disks. Spinal disks are circular tissue that provide cushioning between the bones of the spine (vertebrae). Twisting motions, such as while playing sports or doing yard work. A hit to the back. Arthritis. You may have a physical exam, lab tests, and imaging tests to find the cause of your pain. Acute back pain usually goes away with rest and home care. Follow these instructions at home: Managing pain, stiffness, and swelling Take over-the-counter and prescription medicines only as told by your health care provider. Treatment may include medicines for pain and inflammation that are taken by mouth or applied to the skin, or muscle relaxants. Your health care provider may recommend applying ice during the first 24-48 hours after your pain starts. To do this: Put ice in a plastic bag. Place a towel between your skin and the bag. Leave the ice on for 20 minutes, 2-3 times a day. Remove the ice if your skin turns bright red. This is very important. If you cannot feel pain, heat, or cold, you have a greater risk of damage to the area. If directed, apply heat to the affected area as often as told by your health care provider. Use the heat source that your health care provider recommends, such as a moist heat pack or a heating pad. Place a towel between your skin and the heat source. Leave the heat on for 20-30 minutes. Remove the heat if your skin turns bright red. This is especially important if you are unable to feel pain, heat, or cold. You have a greater risk of getting burned. Activity  Do not stay in bed. Staying in  bed for more than 1-2 days can delay your recovery. Sit up and stand up straight. Avoid leaning forward when you sit or hunching over when you stand. If you work at a desk, sit close to it so you do not need to lean over. Keep your chin tucked in. Keep your neck drawn back, and keep your elbows bent at a 90-degree angle (right angle). Sit high and close to the steering wheel when you drive. Add lower back (lumbar) support to your car seat, if needed. Take short walks on even surfaces as soon as you are able. Try to increase the length of time you walk each day. Do not sit, drive, or stand in one place for more than 30 minutes at a time. Sitting or standing for long periods of time can put stress on your back. Do not drive or use heavy machinery while taking prescription pain medicine. Use proper lifting techniques. When you bend and lift, use positions that put less stress on your back: Bend your knees. Keep the load close to your body. Avoid twisting. Exercise regularly as told by your health care provider. Exercising helps your back heal faster and helps prevent back injuries by keeping muscles strong and flexible. Work with a physical therapist to make a safe exercise program, as recommended by your health care provider. Do any exercises as told by your physical therapist. Lifestyle Maintain a healthy weight. Extra weight puts stress on your back and makes it difficult to have good   posture. Avoid activities or situations that make you feel anxious or stressed. Stress and anxiety increase muscle tension and can make back pain worse. Learn ways to manage anxiety and stress, such as through exercise. General instructions Sleep on a firm mattress in a comfortable position. Try lying on your side with your knees slightly bent. If you lie on your back, put a pillow under your knees. Keep your head and neck in a straight line with your spine (neutral position) when using electronic equipment like  smartphones or pads. To do this: Raise your smartphone or pad to look at it instead of bending your head or neck to look down. Put the smartphone or pad at the level of your face while looking at the screen. Follow your treatment plan as told by your health care provider. This may include: Cognitive or behavioral therapy. Acupuncture or massage therapy. Meditation or yoga. Contact a health care provider if: You have pain that is not relieved with rest or medicine. You have increasing pain going down into your legs or buttocks. Your pain does not improve after 2 weeks. You have pain at night. You lose weight without trying. You have a fever or chills. You develop nausea or vomiting. You develop abdominal pain. Get help right away if: You develop new bowel or bladder control problems. You have unusual weakness or numbness in your arms or legs. You feel faint. These symptoms may represent a serious problem that is an emergency. Do not wait to see if the symptoms will go away. Get medical help right away. Call your local emergency services (911 in the [place] Do not drive yourself to the hospital. Summary Acute back pain is sudden and usually short-lived. Use proper lifting techniques. When you bend and lift, use positions that put less stress on your back. Take over-the-counter and prescription medicines only as told by your health care provider, and apply heat or ice as told. This information is not intended to replace advice given to you by your health care provider. Make sure you discuss any questions you have with your health care provider. Document Revised: 10/06/2020 Document Reviewed: 10/06/2020 Elsevier Patient Education  2024 [employer]  

## 2023-05-20 ENCOUNTER — Encounter: Payer: Self-pay | Admitting: Nurse Practitioner

## 2023-05-20 ENCOUNTER — Ambulatory Visit: Payer: Medicare PPO | Admitting: Nurse Practitioner

## 2023-05-20 VITALS — BP 127/74 | HR 79 | Temp 97.9°F | Wt 131.6 lb

## 2023-05-20 DIAGNOSIS — M545 Low back pain, unspecified: Secondary | ICD-10-CM | POA: Diagnosis not present

## 2023-05-20 DIAGNOSIS — R21 Rash and other nonspecific skin eruption: Secondary | ICD-10-CM | POA: Insufficient documentation

## 2023-05-20 MED ORDER — PREDNISONE 10 MG PO TABS: ORAL_TABLET | ORAL | 0 refills | Status: DC

## 2023-05-20 MED ORDER — TRIAMCINOLONE ACETONIDE 0.1 % EX CREA: 1.0000 | TOPICAL_CREAM | Freq: Two times a day (BID) | CUTANEOUS | 0 refills | Status: DC

## 2023-05-20 MED ORDER — BACLOFEN 10 MG PO TABS: 10.0000 mg | ORAL_TABLET | Freq: Three times a day (TID) | ORAL | 0 refills | Status: DC | PRN

## 2023-05-20 NOTE — Progress Notes (Signed)
BP 127/74   Pulse 79   Temp 97.9 F (36.6 C) (Oral)   Wt 131 lb 9.6 oz (59.7 kg)   SpO2 98%   BMI 22.06 kg/m    Subjective:    Patient ID: Debra Munoz, female    DOB: [birth year], 67 y.o.   MRN: [id number]  HPI: Debra Munoz is a 67 y.o. female  Chief Complaint  Patient presents with   Back Pain   Rash    Pt states she has been having a rash on both of her ankles for the last 2 weeks. States it is starting to appear on her R wrist as well. States the areas do itch.    BACK PAIN Currently has had back pain for about 4 weeks, seen on 05/05/23 for this.  Was given Lidocaine patches and Tizanidine.   Duration: weeks Mechanism of injury: unknown Location: midline, bilateral, and low back Onset: sudden Severity: 5/10 at worst Quality: dull, aching, ill-defined, and throbbing Frequency: intermittent Radiation: none Aggravating factors: lifting, movement, walking, bending, and prolonged sitting Alleviating factors: nothing Status: fluctuating Treatments attempted: Tizanidine, Voltaren gel, Lidocaine patches, Tylenol, heating pad, stretching, Ibuprofen  Relief with NSAIDs?: no Nighttime pain:  no Paresthesias / decreased sensation:  no Bowel / bladder incontinence:  no Fevers:  no Dysuria / urinary frequency:  no   RASH Has been present 2 weeks.  Present to right wrist and medial of both ankles.  No recent gardening.  Does not walk out in weeds. Duration:  weeks  Location: as above  Itching: yes Burning: yes if scratches it Redness: yes Oozing: yes Scaling: no Blisters: yes Painful: no Fevers: no Change in detergents/soaps/personal care products: no Recent illness: no Recent travel:no History of same: no Context: stable Alleviating factors: nothing Treatments attempted:hydrocortisone cream and Calamine Shortness of breath: no  Throat/tongue swelling: no Myalgias/arthralgias: no   Relevant past medical, surgical, family and social history  reviewed and updated as indicated. Interim medical history since our last visit reviewed. Allergies and medications reviewed and updated.  Review of Systems  Constitutional:  Negative for activity change, appetite change, diaphoresis, fatigue and fever.  Respiratory:  Negative for cough, chest tightness, shortness of breath and wheezing.   Cardiovascular:  Negative for chest pain, palpitations and leg swelling.  Musculoskeletal:  Positive for back pain.  Neurological: Negative.   Psychiatric/Behavioral: Negative.      Per HPI unless specifically indicated above     Objective:    BP 127/74   Pulse 79   Temp 97.9 F (36.6 C) (Oral)   Wt 131 lb 9.6 oz (59.7 kg)   SpO2 98%   BMI 22.06 kg/m   Wt Readings from Last 3 Encounters:  05/20/23 131 lb 9.6 oz (59.7 kg)  05/05/23 131 lb 6.4 oz (59.6 kg)  04/14/23 130 lb 6.4 oz (59.1 kg)    Physical Exam Vitals and nursing note reviewed.  Constitutional:      General: She is awake. She is not in acute distress.    Appearance: She is well-developed and well-groomed. She is not ill-appearing or toxic-appearing.  HENT:     Head: Normocephalic.     Right Ear: Hearing and external ear normal.     Left Ear: Hearing and external ear normal.  Eyes:     General: Lids are normal.        Right eye: No discharge.        Left eye: No discharge.     Conjunctiva/sclera:  Conjunctivae normal.     Pupils: Pupils are equal, round, and reactive to light.  Neck:     Thyroid: No thyromegaly.     Vascular: No carotid bruit.  Cardiovascular:     Rate and Rhythm: Normal rate and regular rhythm.     Heart sounds: Murmur heard.     Systolic murmur is present with a grade of 2/6.     No gallop.  Pulmonary:     Effort: Pulmonary effort is normal. No accessory muscle usage or respiratory distress.     Breath sounds: Normal breath sounds. No decreased breath sounds, wheezing or rhonchi.  Abdominal:     General: Bowel sounds are normal. There is no  distension.     Palpations: Abdomen is soft.     Tenderness: There is no abdominal tenderness.  Musculoskeletal:     Cervical back: Normal range of motion and neck supple.     Lumbar back: Tenderness present. No swelling, spasms or bony tenderness. Decreased range of motion. Negative right straight leg raise test and negative left straight leg raise test.     Right lower leg: No edema.     Left lower leg: No edema.     Comments: Decreased extension and lateral + rotation.  Mild tenderness to lower mid back and SI joints.  No rashes.  Lymphadenopathy:     Cervical: No cervical adenopathy.  Skin:    General: Skin is warm and dry.     Findings: Rash present.     Comments: Round cluster-like patches to medial aspect of both ankles and internal aspect of right wrist.  Mild erythema and small clear blisters.    Neurological:     Mental Status: She is alert and oriented to person, place, and time.     Deep Tendon Reflexes: Reflexes are normal and symmetric.     Reflex Scores:      Brachioradialis reflexes are 2+ on the right side and 2+ on the left side.      Patellar reflexes are 2+ on the right side and 2+ on the left side. Psychiatric:        Attention and Perception: Attention normal.        Mood and Affect: Mood normal.        Speech: Speech normal.        Behavior: Behavior normal. Behavior is cooperative.        Thought Content: Thought content normal.     Results for orders placed or performed in visit on 01/16/23  CBC with Differential/Platelet  Result Value Ref Range   WBC 5.6 3.4 - 10.8 x10E3/uL   RBC 4.57 3.77 - 5.28 x10E6/uL   Hemoglobin 13.2 11.1 - 15.9 g/dL   Hematocrit 60.6 30.1 - 46.6 %   MCV 90 79 - 97 fL   MCH 28.9 26.6 - 33.0 pg   MCHC 32.3 31.5 - 35.7 g/dL   RDW 60.1 09.3 - 23.5 %   Platelets 245 150 - 450 x10E3/uL   Neutrophils 71 Not Estab. %   Lymphs 18 Not Estab. %   Monocytes 8 Not Estab. %   Eos 2 Not Estab. %   Basos 1 Not Estab. %   Neutrophils  Absolute 3.9 1.4 - 7.0 x10E3/uL   Lymphocytes Absolute 1.0 0.7 - 3.1 x10E3/uL   Monocytes Absolute 0.5 0.1 - 0.9 x10E3/uL   EOS (ABSOLUTE) 0.1 0.0 - 0.4 x10E3/uL   Basophils Absolute 0.0 0.0 - 0.2 x10E3/uL   Immature Granulocytes  0 Not Estab. %   Immature Grans (Abs) 0.0 0.0 - 0.1 x10E3/uL  Comprehensive metabolic panel  Result Value Ref Range   Glucose 82 70 - 99 mg/dL   BUN 12 8 - 27 mg/dL   Creatinine, Ser 1.61 0.57 - 1.00 mg/dL   eGFR 77 >09 UE/AVW/0.98   BUN/Creatinine Ratio 15 12 - 28   Sodium 138 134 - 144 mmol/L   Potassium 4.1 3.5 - 5.2 mmol/L   Chloride 101 96 - 106 mmol/L   CO2 24 20 - 29 mmol/L   Calcium 9.2 8.7 - 10.3 mg/dL   Total Protein 6.2 6.0 - 8.5 g/dL   Albumin 4.2 3.9 - 4.9 g/dL   Globulin, Total 2.0 1.5 - 4.5 g/dL   Bilirubin Total 0.3 0.0 - 1.2 mg/dL   Alkaline Phosphatase 90 44 - 121 IU/L   AST 13 0 - 40 IU/L   ALT 9 0 - 32 IU/L  Lipid Panel w/o Chol/HDL Ratio  Result Value Ref Range   Cholesterol, Total 183 100 - 199 mg/dL   Triglycerides 119 0 - 149 mg/dL   HDL 46 >14 mg/dL   VLDL Cholesterol Cal 22 5 - 40 mg/dL   LDL Chol Calc (NIH) 782 (H) 0 - 99 mg/dL  TSH  Result Value Ref Range   TSH 2.030 0.450 - 4.500 uIU/mL  VITAMIN D 25 Hydroxy (Vit-D Deficiency, Fractures)  Result Value Ref Range   Vit D, 25-Hydroxy 27.1 (L) 30.0 - 100.0 ng/mL      Assessment & Plan:   Problem List Items Addressed This Visit       Musculoskeletal and Integument   Rash    Acute, ?shingles.  No recent exposures.  Past treatment date.  Will trial Prednisone taper and Triamcinolone ointment.  Recommend to wash hands well after touching areas.  Wash clothes and sheets well.          Other   Back pain - Primary    Acute for 4 weeks, ?arthritic changes. Recommend she continue OTC medications at home.  Sending in Prednisone taper for rash, which may benefit back pain.  Stop Tizanidine and start Baclofen which may offer more benefit.  Lidocaine patches OTC.  Return in 2  weeks and if ongoing will consider imaging and possible PT.      Relevant Medications   predniSONE (DELTASONE) 10 MG tablet   baclofen (LIORESAL) 10 MG tablet     Follow up plan: Return in about 2 weeks (around 06/03/2023) for BACK PAIN.

## 2023-05-20 NOTE — Assessment & Plan Note (Signed)
Acute for 4 weeks, ?arthritic changes. Recommend she continue OTC medications at home.  Sending in Prednisone taper for rash, which may benefit back pain.  Stop Tizanidine and start Baclofen which may offer more benefit.  Lidocaine patches OTC.  Return in 2 weeks and if ongoing will consider imaging and possible PT.

## 2023-05-20 NOTE — Assessment & Plan Note (Signed)
Acute, ?shingles.  No recent exposures.  Past treatment date.  Will trial Prednisone taper and Triamcinolone ointment.  Recommend to wash hands well after touching areas.  Wash clothes and sheets well.

## 2023-06-03 ENCOUNTER — Ambulatory Visit: Payer: Medicare PPO | Admitting: Nurse Practitioner

## 2023-06-11 ENCOUNTER — Other Ambulatory Visit: Payer: Self-pay | Admitting: Nurse Practitioner

## 2023-06-12 NOTE — Telephone Encounter (Signed)
Requested Prescriptions  Pending Prescriptions Disp Refills   baclofen (LIORESAL) 10 MG tablet [Pharmacy Med Name: BACLOFEN 10 MG TABLET] 270 tablet 1    Sig: TAKE 1 TABLET BY MOUTH THREE TIMES A DAY AS NEEDED FOR MUSCLE SPASMS     Analgesics:  Muscle Relaxants - baclofen Passed - 06/11/2023 11:33 AM      Passed - Cr in normal range and within 180 days    Creatinine, Ser  Date Value Ref Range Status  01/16/2023 0.82 0.57 - 1.00 mg/dL Final         Passed - eGFR is 30 or above and within 180 days    eGFR  Date Value Ref Range Status  01/16/2023 77 >59 mL/min/1.73 Final         Passed - Valid encounter within last 6 months    Recent Outpatient Visits           3 weeks ago Acute midline low back pain without sciatica   [place] [facility] [place], Corrie Dandy T, NP   1 month ago Centrilobular emphysema (HCC)   [place] [facility] [place], Corrie Dandy T, NP   1 month ago Acute cough   [place] [facility] [place], Sherran Needs, NP   2 months ago Centrilobular emphysema [facility])   [place] [facility] [place], Corrie Dandy T, NP   3 months ago Primary hypertension   [place] [facility] [place], Dorie Rank, NP       Future Appointments             In 3 months [place], Dorie Rank, NP [place] [facility]

## 2023-08-21 DIAGNOSIS — J209 Acute bronchitis, unspecified: Secondary | ICD-10-CM | POA: Diagnosis not present

## 2023-09-16 ENCOUNTER — Ambulatory Visit: Payer: Medicare PPO

## 2023-09-21 NOTE — Patient Instructions (Signed)

## 2023-09-26 ENCOUNTER — Ambulatory Visit (INDEPENDENT_AMBULATORY_CARE_PROVIDER_SITE_OTHER): Payer: Medicare PPO | Admitting: Nurse Practitioner

## 2023-09-26 ENCOUNTER — Encounter: Payer: Self-pay | Admitting: Nurse Practitioner

## 2023-09-26 VITALS — BP 125/62 | HR 74 | Temp 97.8°F | Ht 65.0 in | Wt 135.8 lb

## 2023-09-26 DIAGNOSIS — I10 Essential (primary) hypertension: Secondary | ICD-10-CM

## 2023-09-26 DIAGNOSIS — R011 Cardiac murmur, unspecified: Secondary | ICD-10-CM

## 2023-09-26 DIAGNOSIS — J432 Centrilobular emphysema: Secondary | ICD-10-CM

## 2023-09-26 DIAGNOSIS — F17210 Nicotine dependence, cigarettes, uncomplicated: Secondary | ICD-10-CM | POA: Diagnosis not present

## 2023-09-26 DIAGNOSIS — E785 Hyperlipidemia, unspecified: Secondary | ICD-10-CM | POA: Diagnosis not present

## 2023-09-26 DIAGNOSIS — I700 Atherosclerosis of aorta: Secondary | ICD-10-CM

## 2023-09-26 DIAGNOSIS — F324 Major depressive disorder, single episode, in partial remission: Secondary | ICD-10-CM

## 2023-09-26 DIAGNOSIS — F419 Anxiety disorder, unspecified: Secondary | ICD-10-CM

## 2023-09-26 MED ORDER — ROSUVASTATIN CALCIUM 10 MG PO TABS: 10.0000 mg | ORAL_TABLET | Freq: Every day | ORAL | 2 refills | Status: DC

## 2023-09-26 MED ORDER — TRAZODONE HCL 50 MG PO TABS: 100.0000 mg | ORAL_TABLET | Freq: Every evening | ORAL | 4 refills | Status: AC | PRN

## 2023-09-26 MED ORDER — SERTRALINE HCL 100 MG PO TABS: 100.0000 mg | ORAL_TABLET | Freq: Every day | ORAL | 4 refills | Status: AC

## 2023-09-26 MED ORDER — BENAZEPRIL HCL 20 MG PO TABS: 20.0000 mg | ORAL_TABLET | Freq: Every day | ORAL | 4 refills | Status: AC

## 2023-09-26 MED ORDER — MONTELUKAST SODIUM 10 MG PO TABS: 10.0000 mg | ORAL_TABLET | Freq: Every day | ORAL | 3 refills | Status: DC

## 2023-09-26 MED ORDER — ALBUTEROL SULFATE HFA 108 (90 BASE) MCG/ACT IN AERS: 2.0000 | INHALATION_SPRAY | Freq: Four times a day (QID) | RESPIRATORY_TRACT | 2 refills | Status: AC | PRN

## 2023-09-26 NOTE — Assessment & Plan Note (Signed)
 Chronic, ongoing.  Continue current medication regimen and adjust as needed. Lipid panel today.

## 2023-09-26 NOTE — Assessment & Plan Note (Signed)
 Chronic, ongoing in long term smoker.  Continue Albuterol as needed for now, but add on maintenance inhaler as needed in future. Spirometry overall reassuring with normal levels on 05/05/23, will repeat annually.  Recommend she use Albuterol inhaler prior to working outside + continue Singulair.  Educated her on medication and BLACK BOX warning.  Repeat spirometry in October 2025.  Recommend she return for lung cancer screening when affordable.

## 2023-09-26 NOTE — Assessment & Plan Note (Signed)
 Chronic, noted on CT 03/12/23.  Educated patient on this.  Will continue Rosuvastatin daily + recommend a daily Baby ASA 81 MG.

## 2023-09-26 NOTE — Assessment & Plan Note (Signed)
 I have recommended complete cessation of tobacco use. I have discussed various options available for assistance with tobacco cessation including over the counter methods (Nicotine gum, patch and lozenges). We also discussed prescription options (Chantix, Nicotine Inhaler / Nasal Spray). The patient is not interested in pursuing any prescription tobacco cessation options at this time. Recommend she return to lung cancer screening when affordable.

## 2023-09-26 NOTE — Progress Notes (Signed)
 BP 125/62   Pulse 74   Temp 97.8 F (36.6 C) (Oral)   Ht 5\' 5"  (1.651 m)   Wt 135 lb 12.8 oz (61.6 kg)   SpO2 98%   BMI 22.60 kg/m    Subjective:    Patient ID: Debra Munoz, female    DOB: 11-27-1955, 68 y.o.   MRN: [id number]  HPI: Debra Munoz is a 68 y.o. female  Chief Complaint  Patient presents with   COPD   Hyperlipidemia   Hypertension   HYPERTENSION / HYPERLIPIDEMIA Continues on Benazepril and Rosuvastatin. Satisfied with current treatment? yes Duration of hypertension: chronic BP monitoring frequency: weekly BP range: 120/70 range BP medication side effects: no Duration of hyperlipidemia: chronic Cholesterol medication side effects: no Cholesterol supplements: none Medication compliance: good compliance Aspirin: no Recent stressors: no Recurrent headaches: no Visual changes: no Palpitations: no Dyspnea: no Chest pain: no Lower extremity edema: no Dizzy/lightheaded: no   COPD Does not use Albuterol often + takes Singulair.  Last lung screening on 03/12/23 -- moderate centrilobular emphysema and aortic atherosclerosis.  Was to have follow-up recently to check on nodule but cancelled due to cost.  Continues to smoke about 1 PPD. COPD status: stable Satisfied with current treatment?: yes Oxygen use: no Dyspnea frequency: none Cough frequency: occasional Rescue inhaler frequency:  occasional Limitation of activity: no Productive cough: no Last Spirometry: 05/05/2023 Pneumovax: Up to Date Influenza: Up to Date   DEPRESSION Continues Sertraline and Trazodone. Mood status: stable Satisfied with current treatment?: yes Symptom severity: mild  Duration of current treatment : chronic Side effects: no Medication compliance: good compliance Psychotherapy/counseling: none Depressed mood: no Anxious mood: no Anhedonia: no Significant weight loss or gain: no Insomnia: none Fatigue: no Feelings of worthlessness or guilt:  no Impaired concentration/indecisiveness: no Suicidal ideations: no Hopelessness: no Crying spells: no    09/26/2023    9:35 AM 05/20/2023    1:22 PM 05/05/2023    9:34 AM 04/14/2023    3:36 PM 03/28/2023    9:58 AM  Depression screen PHQ 2/9  Decreased Interest 1 0 0 1 0  Down, Depressed, Hopeless 0 0 0 0 0  PHQ - 2 Score 1 0 0 1 0  Altered sleeping 0 0 0 0 0  Tired, decreased energy 1 0 0 2 0  Change in appetite 1 1 0 1 0  Feeling bad or failure about yourself  0 0 0 0 0  Trouble concentrating 0 0 0 0 0  Moving slowly or fidgety/restless 0 2 0 0 0  Suicidal thoughts 0 0 0 0 0  PHQ-9 Score 3 3 0 4 0  Difficult doing work/chores Not difficult at all Somewhat difficult Not difficult at all Somewhat difficult Not difficult at all       09/26/2023    9:35 AM 05/20/2023    1:22 PM 05/05/2023    9:35 AM 04/14/2023    3:36 PM  GAD 7 : Generalized Anxiety Score  Nervous, Anxious, on Edge 0 0 0 0  Control/stop worrying 0 0 0 0  Worry too much - different things 0 0 0 0  Trouble relaxing 0 1 1 0  Restless 0 0 0 0  Easily annoyed or irritable 0 0 0 0  Afraid - awful might happen 0 0 0 0  Total GAD 7 Score 0 1 1 0  Anxiety Difficulty  Somewhat difficult Not difficult at all Not difficult at all   Relevant past medical,  surgical, family and social history reviewed and updated as indicated. Interim medical history since our last visit reviewed. Allergies and medications reviewed and updated.  Review of Systems  Constitutional:  Negative for activity change, appetite change, diaphoresis, fatigue and fever.  Respiratory:  Negative for cough, chest tightness and shortness of breath.   Cardiovascular:  Negative for chest pain, palpitations and leg swelling.  Gastrointestinal: Negative.   Neurological: Negative.   Psychiatric/Behavioral: Negative.      Per HPI unless specifically indicated above     Objective:    BP 125/62   Pulse 74   Temp 97.8 F (36.6 C) (Oral)   Ht 5\' 5"   (1.651 m)   Wt 135 lb 12.8 oz (61.6 kg)   SpO2 98%   BMI 22.60 kg/m   Wt Readings from Last 3 Encounters:  09/26/23 135 lb 12.8 oz (61.6 kg)  05/20/23 131 lb 9.6 oz (59.7 kg)  05/05/23 131 lb 6.4 oz (59.6 kg)    Physical Exam Vitals and nursing note reviewed.  Constitutional:      General: She is awake. She is not in acute distress.    Appearance: She is well-developed and well-groomed. She is not ill-appearing or toxic-appearing.  HENT:     Head: Normocephalic.     Right Ear: Hearing and external ear normal.     Left Ear: Hearing and external ear normal.  Eyes:     General: Lids are normal.        Right eye: No discharge.        Left eye: No discharge.     Conjunctiva/sclera: Conjunctivae normal.     Pupils: Pupils are equal, round, and reactive to light.  Neck:     Thyroid: No thyromegaly.     Vascular: No carotid bruit.  Cardiovascular:     Rate and Rhythm: Normal rate and regular rhythm.     Heart sounds: Murmur heard.     Systolic murmur is present with a grade of 2/6.     No gallop.  Pulmonary:     Effort: Pulmonary effort is normal. No accessory muscle usage or respiratory distress.     Breath sounds: Normal breath sounds. No decreased breath sounds, wheezing or rales.  Abdominal:     General: Bowel sounds are normal. There is no distension.     Palpations: Abdomen is soft.     Tenderness: There is no abdominal tenderness.  Musculoskeletal:     Cervical back: Normal range of motion and neck supple.     Right lower leg: No edema.     Left lower leg: No edema.  Lymphadenopathy:     Cervical: No cervical adenopathy.  Skin:    General: Skin is warm and dry.  Neurological:     Mental Status: She is alert and oriented to person, place, and time.     Deep Tendon Reflexes: Reflexes are normal and symmetric.     Reflex Scores:      Brachioradialis reflexes are 2+ on the right side and 2+ on the left side.      Patellar reflexes are 2+ on the right side and 2+ on the  left side. Psychiatric:        Attention and Perception: Attention normal.        Mood and Affect: Mood normal.        Speech: Speech normal.        Behavior: Behavior normal. Behavior is cooperative.  Thought Content: Thought content normal.    Results for orders placed or performed in visit on 01/16/23  CBC with Differential/Platelet   Collection Time: 01/16/23  8:14 AM  Result Value Ref Range   WBC 5.6 3.4 - 10.8 x10E3/uL   RBC 4.57 3.77 - 5.28 x10E6/uL   Hemoglobin 13.2 11.1 - 15.9 g/dL   Hematocrit 40.9 81.1 - 46.6 %   MCV 90 79 - 97 fL   MCH 28.9 26.6 - 33.0 pg   MCHC 32.3 31.5 - 35.7 g/dL   RDW 91.4 78.2 - 95.6 %   Platelets 245 150 - 450 x10E3/uL   Neutrophils 71 Not Estab. %   Lymphs 18 Not Estab. %   Monocytes 8 Not Estab. %   Eos 2 Not Estab. %   Basos 1 Not Estab. %   Neutrophils Absolute 3.9 1.4 - 7.0 x10E3/uL   Lymphocytes Absolute 1.0 0.7 - 3.1 x10E3/uL   Monocytes Absolute 0.5 0.1 - 0.9 x10E3/uL   EOS (ABSOLUTE) 0.1 0.0 - 0.4 x10E3/uL   Basophils Absolute 0.0 0.0 - 0.2 x10E3/uL   Immature Granulocytes 0 Not Estab. %   Immature Grans (Abs) 0.0 0.0 - 0.1 x10E3/uL  Comprehensive metabolic panel   Collection Time: 01/16/23  8:14 AM  Result Value Ref Range   Glucose 82 70 - 99 mg/dL   BUN 12 8 - 27 mg/dL   Creatinine, Ser 2.13 0.57 - 1.00 mg/dL   eGFR 77 >08 MV/HQI/6.96   BUN/Creatinine Ratio 15 12 - 28   Sodium 138 134 - 144 mmol/L   Potassium 4.1 3.5 - 5.2 mmol/L   Chloride 101 96 - 106 mmol/L   CO2 24 20 - 29 mmol/L   Calcium 9.2 8.7 - 10.3 mg/dL   Total Protein 6.2 6.0 - 8.5 g/dL   Albumin 4.2 3.9 - 4.9 g/dL   Globulin, Total 2.0 1.5 - 4.5 g/dL   Bilirubin Total 0.3 0.0 - 1.2 mg/dL   Alkaline Phosphatase 90 44 - 121 IU/L   AST 13 0 - 40 IU/L   ALT 9 0 - 32 IU/L  Lipid Panel w/o Chol/HDL Ratio   Collection Time: 01/16/23  8:14 AM  Result Value Ref Range   Cholesterol, Total 183 100 - 199 mg/dL   Triglycerides 295 0 - 149 mg/dL   HDL 46  >28 mg/dL   VLDL Cholesterol Cal 22 5 - 40 mg/dL   LDL Chol Calc (NIH) 413 (H) 0 - 99 mg/dL  TSH   Collection Time: 01/16/23  8:14 AM  Result Value Ref Range   TSH 2.030 0.450 - 4.500 uIU/mL  VITAMIN D 25 Hydroxy (Vit-D Deficiency, Fractures)   Collection Time: 01/16/23  8:14 AM  Result Value Ref Range   Vit D, 25-Hydroxy 27.1 (L) 30.0 - 100.0 ng/mL      Assessment & Plan:   Problem List Items Addressed This Visit       Cardiovascular and Mediastinum   Aortic atherosclerosis (HCC)   Chronic, noted on CT 03/12/23.  Educated patient on this.  Will continue Rosuvastatin daily + recommend a daily Baby ASA 81 MG.      Relevant Medications   rosuvastatin (CRESTOR) 10 MG tablet   benazepril (LOTENSIN) 20 MG tablet   Hypertension   Chronic,ongoing.  BP at goal.  Will continue Benazepril 20 MG daily, which she tolerated in past -- PCP would prefer ARB due to her COPD, but she tolerated ACE better in past.  Continue diet and exercise  focus + maintaining weight loss.  Recommend she monitor BP at least a few mornings a week at home and document.  DASH diet at home.  Labs today: CBC and CMP.  Recommend cut back on smoking.         Relevant Medications   rosuvastatin (CRESTOR) 10 MG tablet   benazepril (LOTENSIN) 20 MG tablet   Other Relevant Orders   CBC with Differential/Platelet   Comprehensive metabolic panel     Respiratory   Centrilobular emphysema (HCC) - Primary   Chronic, ongoing in long term smoker.  Continue Albuterol as needed for now, but add on maintenance inhaler as needed in future. Spirometry overall reassuring with normal levels on 05/05/23, will repeat annually.  Recommend she use Albuterol inhaler prior to working outside + continue Singulair.  Educated her on medication and BLACK BOX warning.  Repeat spirometry in October 2025.  Recommend she return for lung cancer screening when affordable.      Relevant Medications   albuterol (VENTOLIN HFA) 108 (90 Base) MCG/ACT  inhaler   montelukast (SINGULAIR) 10 MG tablet   Other Relevant Orders   CBC with Differential/Platelet     Other   Anxiety   Refer to depression plan of care.      Relevant Medications   sertraline (ZOLOFT) 100 MG tablet   traZODone (DESYREL) 50 MG tablet   Cigarette nicotine dependence without complication   I have recommended complete cessation of tobacco use. I have discussed various options available for assistance with tobacco cessation including over the counter methods (Nicotine gum, patch and lozenges). We also discussed prescription options (Chantix, Nicotine Inhaler / Nasal Spray). The patient is not interested in pursuing any prescription tobacco cessation options at this time. Recommend she return to lung cancer screening when affordable.      Depression   Chronic, stable.  Denies SI/HI.  Continue Zoloft and Trazodone, which offer benefit to overall mood.  Refills sent in.  Consider therapy if worsening mood.        Relevant Medications   sertraline (ZOLOFT) 100 MG tablet   traZODone (DESYREL) 50 MG tablet   Dyslipidemia   Chronic, ongoing.  Continue current medication regimen and adjust as needed.  Lipid panel today.      Relevant Medications   rosuvastatin (CRESTOR) 10 MG tablet   Other Relevant Orders   Comprehensive metabolic panel   Lipid Panel w/o Chol/HDL Ratio   Heart murmur   Systolic 2/6 noted left upper chest, no symptoms.  Consider imaging if worsening or symptoms present.        Follow up plan: Return in about 6 months (around 03/25/2024) for Annual Physical.

## 2023-09-26 NOTE — Assessment & Plan Note (Signed)
 Chronic,ongoing.  BP at goal.  Will continue Benazepril 20 MG daily, which she tolerated in past -- PCP would prefer ARB due to her COPD, but she tolerated ACE better in past.  Continue diet and exercise focus + maintaining weight loss.  Recommend she monitor BP at least a few mornings a week at home and document.  DASH diet at home.  Labs today: CBC and CMP.  Recommend cut back on smoking.

## 2023-09-26 NOTE — Assessment & Plan Note (Signed)
Systolic 2/6 noted left upper chest, no symptoms.  Consider imaging if worsening or symptoms present. 

## 2023-09-26 NOTE — Assessment & Plan Note (Signed)
 Refer to depression plan of care.

## 2023-09-26 NOTE — Assessment & Plan Note (Signed)
 Chronic, stable.  Denies SI/HI.  Continue Zoloft and Trazodone, which offer benefit to overall mood.  Refills sent in.  Consider therapy if worsening mood.

## 2023-09-27 ENCOUNTER — Encounter: Payer: Self-pay | Admitting: Nurse Practitioner

## 2023-09-27 LAB — CBC WITH DIFFERENTIAL/PLATELET
Basophils Absolute: 0 10*3/uL (ref 0.0–0.2)
Basos: 1 %
EOS (ABSOLUTE): 0.1 10*3/uL (ref 0.0–0.4)
Eos: 3 %
Hematocrit: 41.6 % (ref 34.0–46.6)
Hemoglobin: 13.6 g/dL (ref 11.1–15.9)
Immature Grans (Abs): 0 10*3/uL (ref 0.0–0.1)
Immature Granulocytes: 1 %
Lymphocytes Absolute: 1.1 10*3/uL (ref 0.7–3.1)
Lymphs: 20 %
MCH: 30.4 pg (ref 26.6–33.0)
MCHC: 32.7 g/dL (ref 31.5–35.7)
MCV: 93 fL (ref 79–97)
Monocytes Absolute: 0.4 10*3/uL (ref 0.1–0.9)
Monocytes: 7 %
Neutrophils Absolute: 3.9 10*3/uL (ref 1.4–7.0)
Neutrophils: 68 %
Platelets: 248 10*3/uL (ref 150–450)
RBC: 4.47 x10E6/uL (ref 3.77–5.28)
RDW: 12.2 % (ref 11.7–15.4)
WBC: 5.5 10*3/uL (ref 3.4–10.8)

## 2023-09-27 LAB — COMPREHENSIVE METABOLIC PANEL
ALT: 14 IU/L (ref 0–32)
AST: 19 IU/L (ref 0–40)
Albumin: 4.3 g/dL (ref 3.9–4.9)
Alkaline Phosphatase: 81 IU/L (ref 44–121)
BUN/Creatinine Ratio: 11 — ABNORMAL LOW (ref 12–28)
BUN: 10 mg/dL (ref 8–27)
Bilirubin Total: 0.4 mg/dL (ref 0.0–1.2)
CO2: 22 mmol/L (ref 20–29)
Calcium: 9.1 mg/dL (ref 8.7–10.3)
Chloride: 101 mmol/L (ref 96–106)
Creatinine, Ser: 0.87 mg/dL (ref 0.57–1.00)
Globulin, Total: 2 g/dL (ref 1.5–4.5)
Glucose: 79 mg/dL (ref 70–99)
Potassium: 4.3 mmol/L (ref 3.5–5.2)
Sodium: 138 mmol/L (ref 134–144)
Total Protein: 6.3 g/dL (ref 6.0–8.5)
eGFR: 72 mL/min/{1.73_m2} (ref >59)

## 2023-09-27 LAB — LIPID PANEL W/O CHOL/HDL RATIO
Cholesterol, Total: 142 mg/dL (ref 100–199)
HDL: 45 mg/dL (ref >39)
LDL Chol Calc (NIH): 73 mg/dL (ref 0–99)
Triglycerides: 134 mg/dL (ref 0–149)
VLDL Cholesterol Cal: 24 mg/dL (ref 5–40)

## 2023-09-27 NOTE — Progress Notes (Signed)
 Contacted via MyChart   Good morning Debra Munoz, your labs have returned and overall are stable. No medication changes needed.  Kidney function, creatinine and eGFR, remains normal, as is liver function, AST and ALT. CBC shows no anemia or infection.  Lipid panel shows improved LDL, continue statin therapy.  Any questions? Keep being amazing!!  Thank you for allowing me to participate in your care.  I appreciate you. Kindest regards, Tequia Wolman

## 2023-10-27 ENCOUNTER — Other Ambulatory Visit: Payer: Self-pay | Admitting: Nurse Practitioner

## 2023-10-28 NOTE — Telephone Encounter (Signed)
 Requested by interface surescripts. Last OV 09/26/23.  Requested Prescriptions  Pending Prescriptions Disp Refills   montelukast (SINGULAIR) 10 MG tablet [Pharmacy Med Name: MONTELUKAST SOD 10 MG TABLET] 90 tablet 2    Sig: TAKE 1 TABLET BY MOUTH EVERYDAY AT BEDTIME     Pulmonology:  Leukotriene Inhibitors Passed - 10/28/2023  4:22 PM      Passed - Valid encounter within last 12 months    Recent Outpatient Visits           1 month ago Centrilobular emphysema (HCC)   [place] [facility] [place], Dorie Rank, NP

## 2023-12-15 ENCOUNTER — Encounter: Payer: Self-pay | Admitting: Nurse Practitioner

## 2023-12-15 MED ORDER — TRIAMCINOLONE ACETONIDE 0.1 % EX CREA: 1.0000 | TOPICAL_CREAM | Freq: Two times a day (BID) | CUTANEOUS | 1 refills | Status: AC

## 2024-01-20 ENCOUNTER — Ambulatory Visit

## 2024-03-30 ENCOUNTER — Ambulatory Visit

## 2024-04-06 ENCOUNTER — Ambulatory Visit: Admitting: Emergency Medicine

## 2024-04-06 VITALS — Ht 65.0 in | Wt 135.0 lb

## 2024-04-06 DIAGNOSIS — Z0000 Encounter for general adult medical examination without abnormal findings: Secondary | ICD-10-CM | POA: Diagnosis not present

## 2024-04-06 NOTE — Patient Instructions (Signed)
 [name],  Thank you for taking the time for your Medicare Wellness Visit. I appreciate your continued commitment to your health goals. Please review the care plan we discussed, and feel free to reach out if I can assist you further.  Medicare recommends these wellness visits once per year to help you and your care team stay ahead of potential health issues. These visits are designed to focus on prevention, allowing your provider to concentrate on managing your acute and chronic conditions during your regular appointments.  Please note that Annual Wellness Visits do not include a physical exam. Some assessments may be limited, especially if the visit was conducted virtually. If needed, we may recommend a separate in-person follow-up with your provider.  Ongoing Care Seeing your primary care provider every 3 to 6 months helps us  monitor your health and provide consistent, personalized care.   Referrals If a referral was made during today's visit and you haven't received any updates within two weeks, please contact the referred provider directly to check on the status.  Recommended Screenings: Get the flu vaccine at your next OV on 04/12/24. Get the shingles vaccines at your local pharmacy at your convenience.   Health Maintenance  Topic Date Due   Colon Cancer Screening  Never done   Zoster (Shingles) Vaccine (1 of 2) Never done   Flu Shot  02/27/2024   Screening for Lung Cancer  03/11/2024   COVID-19 Vaccine (1 - 2024-25 season) Never done   DEXA scan (bone density measurement)  08/04/2024   Medicare Annual Wellness Visit  04/06/2025   Mammogram  04/22/2025   DTaP/Tdap/Td vaccine (3 - Td or Tdap) 01/15/2033   Pneumococcal Vaccine for age over 75  Completed   Hepatitis C Screening  Completed   HPV Vaccine  Aged Out   Meningitis B Vaccine  Aged Out       04/06/2024   11:32 AM  Advanced Directives  Does Patient Have a Medical Advance Directive? No  Would patient like information on  creating a medical advance directive? Yes (MAU/Ambulatory/Procedural Areas - Information given)   Advance Care Planning is important because it: Ensures you receive medical care that aligns with your values, goals, and preferences. Provides guidance to your family and loved ones, reducing the emotional burden of decision-making during critical moments. Information on Advanced Care Planning can be found at [employer]  Secretary of [facility] Advance Health Care Directives Advance Health Care Directives ([place].gov)You may also get the forms at your doctor's office.  Vision: Annual vision screenings are recommended for early detection of glaucoma, cataracts, and diabetic retinopathy. These exams can also reveal signs of chronic conditions such as diabetes and high blood pressure.  Dental: Annual dental screenings help detect early signs of oral cancer, gum disease, and other conditions linked to overall health, including heart disease and diabetes.  Please see the attached documents for additional preventive care recommendations.

## 2024-04-06 NOTE — Progress Notes (Signed)
 Subjective:   Debra Munoz is a 68 y.o. who presents for a Medicare Wellness preventive visit.  As a reminder, Annual Wellness Visits don't include a physical exam, and some assessments may be limited, especially if this visit is performed virtually. We may recommend an in-person follow-up visit with your provider if needed.  Visit Complete: Virtual I connected with  Debra Munoz on 04/06/24 by a audio enabled telemedicine application and verified that I am speaking with the correct person using two identifiers.  Patient Location: Home  Provider Location: Office/Clinic  I discussed the limitations of evaluation and management by telemedicine. The patient expressed understanding and agreed to proceed.  Vital Signs: Because this visit was a virtual/telehealth visit, some criteria may be missing or patient reported. Any vitals not documented were not able to be obtained and vitals that have been documented are patient reported.  VideoDeclined- This patient declined Librarian, academic. Therefore the visit was completed with audio only.  Persons Participating in Visit: Patient.  AWV Questionnaire: No: Patient Medicare AWV questionnaire was not completed prior to this visit.  Cardiac Risk Factors include: advanced age (>69men, >71 women);dyslipidemia;hypertension;smoking/ tobacco exposure     Objective:    Today's Vitals   04/06/24 1124  Weight: 135 lb (61.2 kg)  Height: 5' 5 (1.651 m)   Body mass index is 22.47 kg/m.     04/06/2024   11:32 AM  Advanced Directives  Does Patient Have a Medical Advance Directive? No  Would patient like information on creating a medical advance directive? Yes (MAU/Ambulatory/Procedural Areas - Information given)    Current Medications (verified) Outpatient Encounter Medications as of 04/06/2024  Medication Sig   albuterol  (VENTOLIN  HFA) 108 (90 Base) MCG/ACT inhaler Inhale 2 puffs into the lungs  every 6 (six) hours as needed for wheezing or shortness of breath.   benazepril  (LOTENSIN ) 20 MG tablet Take 1 tablet (20 mg total) by mouth daily.   diphenhydrAMINE HCl (BENADRYL ALLERGY PO) Take 1 tablet by mouth daily.   montelukast  (SINGULAIR ) 10 MG tablet TAKE 1 TABLET BY MOUTH EVERYDAY AT BEDTIME   rosuvastatin  (CRESTOR ) 10 MG tablet Take 1 tablet (10 mg total) by mouth daily.   sertraline  (ZOLOFT ) 100 MG tablet Take 1 tablet (100 mg total) by mouth daily.   traZODone  (DESYREL ) 50 MG tablet Take 2 tablets (100 mg total) by mouth at bedtime as needed.   triamcinolone  cream (KENALOG ) 0.1 % Apply 1 Application topically 2 (two) times daily.   No facility-administered encounter medications on file as of 04/06/2024.    Allergies (verified) Patient has no known allergies.   History: Past Medical History:  Diagnosis Date   Arthritis    Heart murmur    Past Surgical History:  Procedure Laterality Date   FRACTURE SURGERY     TUBAL LIGATION     Family History  Problem Relation Age of Onset   Heart disease Mother    Emphysema Mother    Diabetes Mother    Diabetes Sister    Diabetes Sister    Aneurysm Maternal Grandfather        brain   Heart disease Paternal Grandfather    Social History   Socioeconomic History   Marital status: Married    Spouse name: [place]   Number of children: 2   Years of education: Not on file   Highest education level: Not on file  Occupational History   Occupation: retired  Tobacco Use   Smoking status:  Every Day    Current packs/day: 0.75    Average packs/day: 0.8 packs/day for 50.7 years (38.0 ttl pk-yrs)    Types: Cigarettes    Start date: 45    Passive exposure: Past   Smokeless tobacco: Never  Vaping Use   Vaping status: Never Used  Substance and Sexual Activity   Alcohol use: Not Currently   Drug use: Never   Sexual activity: Not Currently  Other Topics Concern   Not on file  Social History Narrative   Not on file   Social  Drivers of Health   Financial Resource Strain: Low Risk  (04/06/2024)   Overall Financial Resource Strain (CARDIA)    Difficulty of Paying Living Expenses: Not hard at all  Food Insecurity: No Food Insecurity (04/06/2024)   Hunger Vital Sign    Worried About Running Out of Food in the Last Year: Never true    Ran Out of Food in the Last Year: Never true  Transportation Needs: No Transportation Needs (04/06/2024)   PRAPARE - Administrator, Civil Service (Medical): No    Lack of Transportation (Non-Medical): No  Physical Activity: Inactive (04/06/2024)   Exercise Vital Sign    Days of Exercise per Week: 0 days    Minutes of Exercise per Session: 0 min  Stress: No Stress Concern Present (04/06/2024)   [employer] of Occupational Health - Occupational Stress Questionnaire    Feeling of Stress: Not at all  Social Connections: Moderately Isolated (04/06/2024)   Social Connection and Isolation Panel    Frequency of Communication with Friends and Family: More than three times a week    Frequency of Social Gatherings with Friends and Family: Three times a week    Attends Religious Services: Never    Active Member of Clubs or Organizations: No    Attends Banker Meetings: Never    Marital Status: Married    Tobacco Counseling Ready to quit: Not Answered Counseling given: Not Answered    Clinical Intake:  Pre-visit preparation completed: Yes  Pain : No/denies pain     BMI - recorded: 22.47 Nutritional Status: BMI of 19-24  Normal Nutritional Risks: None Diabetes: No  No results found for: HGBA1C   How often do you need to have someone help you when you read instructions, pamphlets, or other written materials from your doctor or pharmacy?: 1 - Never  Interpreter Needed?: No  Information entered by :: Vina Ned, CMA   Activities of Daily Living     04/06/2024   11:26 AM  In your present state of health, do you have any difficulty performing the  following activities:  Hearing? 0  Vision? 0  Difficulty concentrating or making decisions? 0  Walking or climbing stairs? 0  Dressing or bathing? 0  Doing errands, shopping? 0  Preparing Food and eating ? N  Using the Toilet? N  In the past six months, have you accidently leaked urine? Y  Comment wears a pad  Do you have problems with loss of bowel control? N  Managing your Medications? N  Managing your Finances? N  Housekeeping or managing your Housekeeping? N    Patient Care Team: Cannady, Jolene T, NP as PCP - General (Nurse Practitioner)  I have updated your Care Teams any recent Medical Services you may have received from other providers in the past year.     Assessment:   This is a routine wellness examination for [place].  Hearing/Vision screen Hearing Screening -  Comments:: Denies hearing loss  Vision Screening - Comments:: Gets routine eye exams, [facility], [address], [place]   Goals Addressed               This Visit's Progress     DIET - INCREASE WATER INTAKE (pt-stated)         Depression Screen     04/06/2024   11:31 AM 09/26/2023    9:35 AM 05/20/2023    1:22 PM 05/05/2023    9:34 AM 04/14/2023    3:36 PM 03/28/2023    9:58 AM 02/13/2023    9:06 AM  PHQ 2/9 Scores  PHQ - 2 Score 0 1 0 0 1 0 0  PHQ- 9 Score 0 3 3 0 4 0 0    Fall Risk     04/06/2024   11:34 AM 09/26/2023    9:35 AM 05/20/2023    1:22 PM 05/05/2023    9:34 AM 03/28/2023    9:58 AM  Fall Risk   Falls in the past year? 0 0 0 0 0  Number falls in past yr: 0 0 0 0 0  Injury with Fall? 0 0 0 0 0  Risk for fall due to : No Fall Risks No Fall Risks No Fall Risks No Fall Risks No Fall Risks  Follow up Falls evaluation completed Falls evaluation completed Falls evaluation completed Falls evaluation completed Falls evaluation completed    MEDICARE RISK AT HOME:  Medicare Risk at Home Any stairs in or around the home?: Yes If so, are there any without handrails?: No Home  free of loose throw rugs in walkways, pet beds, electrical cords, etc?: Yes Adequate lighting in your home to reduce risk of falls?: Yes Life alert?: No Use of a cane, walker or w/c?: No Grab bars in the bathroom?: Yes Shower chair or bench in shower?: No Elevated toilet seat or a handicapped toilet?: Yes  TIMED UP AND GO:  Was the test performed?  No  Cognitive Function: 6CIT completed        04/06/2024   11:35 AM 01/16/2023    8:25 AM  6CIT Screen  What Year? 0 points 0 points  What month? 0 points 0 points  What time? 0 points 0 points  Count back from 20 0 points 0 points  Months in reverse 0 points 0 points  Repeat phrase 0 points 0 points  Total Score 0 points 0 points    Immunizations Immunization History  Administered Date(s) Administered   Fluad Quad(high Dose 65+) 07/16/2022   Fluad Trivalent(High Dose 65+) 03/28/2023   Influenza,inj,Quad PF,6+ Mos 05/25/2014   Influenza-Unspecified 04/15/2013   PNEUMOCOCCAL CONJUGATE-20 01/16/2023   RSV,unspecified 07/29/2022   Td 01/16/2023   Tdap 12/19/2011    Screening Tests Health Maintenance  Topic Date Due   Colonoscopy  Never done   Zoster Vaccines- Shingrix (1 of 2) Never done   Influenza Vaccine  02/27/2024   Lung Cancer Screening  03/11/2024   COVID-19 Vaccine (1 - 2024-25 season) Never done   DEXA SCAN  08/04/2024   Medicare Annual Wellness (AWV)  04/06/2025   MAMMOGRAM  04/22/2025   DTaP/Tdap/Td (3 - Td or Tdap) 01/15/2033   Pneumococcal Vaccine: 50+ Years  Completed   Hepatitis C Screening  Completed   HPV VACCINES  Aged Out   Meningococcal B Vaccine  Aged Out    Health Maintenance Items Addressed: See Nurse Notes at the end of this note  Additional Screening:  Vision Screening: Recommended annual ophthalmology exams for early detection of glaucoma and other disorders of the eye. Is the patient up to date with their annual eye exam?  Yes  Who is the provider or what is the name of the office in  which the patient attends annual eye exams? [facility], [address], [place]  Dental Screening: Recommended annual dental exams for proper oral hygiene  Community Resource Referral / Chronic Care Management: CRR required this visit?  No   CCM required this visit?  No   Plan:    I have personally reviewed and noted the following in the patient's chart:   Medical and social history Use of alcohol, tobacco or illicit drugs  Current medications and supplements including opioid prescriptions. Patient is not currently taking opioid prescriptions. Functional ability and status Nutritional status Physical activity Advanced directives List of other physicians Hospitalizations, surgeries, and ER visits in previous 12 months Vitals Screenings to include cognitive, depression, and falls Referrals and appointments  In addition, I have reviewed and discussed with patient certain preventive protocols, quality metrics, and best practice recommendations. A written personalized care plan for preventive services as well as general preventive health recommendations were provided to patient.   Vina Ned, CMA   04/06/2024   After Visit Summary: (MyChart) Due to this being a telephonic visit, the after visit summary with patients personalized plan was offered to patient via MyChart   Notes:  Wants flu vaccine at next OV on 04/12/24 Needs Shingles vaccines (pharmacy) Declined Covid vaccine Declined colonoscopy and cologuard Declined LDCT scan

## 2024-04-10 NOTE — Patient Instructions (Signed)
 Be Involved in Caring For Your Health:  Taking Medications When medications are taken as directed, they can greatly improve your health. But if they are not taken as prescribed, they may not work. In some cases, not taking them correctly can be harmful. To help ensure your treatment remains effective and safe, understand your medications and how to take them. Bring your medications to each visit for review by your provider.  Your lab results, notes, and after visit summary will be available on My Chart. We strongly encourage you to use this feature. If lab results are abnormal the clinic will contact you with the appropriate steps. If the clinic does not contact you assume the results are satisfactory. You can always view your results on My Chart. If you have questions regarding your health or results, please contact the clinic during office hours. You can also ask questions on My Chart.  We at [facility] are grateful that you chose [place] to provide your care. We strive to provide evidence-based and compassionate care and are always looking for feedback. If you get a survey from the clinic please complete this so we can hear your opinions.  Eating Plan for Chronic Obstructive Pulmonary Disease Chronic obstructive pulmonary disease (COPD) causes symptoms such as shortness of breath, coughing, and chest discomfort. These symptoms can make it difficult to eat enough to maintain a healthy weight. Generally, people with COPD should eat a diet that is high in calories, protein, and other nutrients to maintain body weight and to keep the lungs as healthy as possible. Depending on the medicines you take and other health conditions you may have, your health care provider may give you additional recommendations on what to eat or avoid. Talk with your health care provider about your goals for body weight, and work with a dietitian to develop an eating plan that is right for you. What are tips for  following this plan? Reading food labels  Avoid foods with more than 300 milligrams (mg) of salt (sodium) per serving. Choose foods that contain at least 4 grams (g) of fiber per serving. Try to eat 20-30 g of fiber each day. Choose foods that are high in calories and protein, such as nuts, beans, yogurt, and cheese. Shopping Do not buy foods labeled as diet, low-calorie, or low-fat. If you are able to eat dairy products: Avoid low-fat or skim milk. Buy dairy products that have at least 2% fat. Buy nutritional supplement drinks. Buy grains and prepared foods labeled as enriched or fortified. Consider buying low-sodium, pre-made foods to conserve energy for eating. Cooking Add dry milk or protein powder to smoothies. Cook with healthy fats, such as olive oil, canola oil, sunflower oil, and grapeseed oil. Add oil, butter, cream cheese, or nut butters to foods to increase fat and calories. To make foods easier to chew and swallow: Cook vegetables, pasta, and rice until soft. Cut or grind meat into very small pieces. Dip breads in liquid. Meal planning  Eat when you feel hungry. Eat 5-6 small meals throughout the day. Drink 6-8 glasses of water each day. Do not drink liquids with meals. Drink liquids at the end of the meal to avoid feeling full too quickly. Eat a variety of fruits and vegetables every day. Ask for assistance from family or friends with planning and preparing meals as needed. Avoid foods that cause you to feel bloated, such as carbonated drinks, fried foods, beans, broccoli, cabbage, and apples. For older adults, ask your local  agency on aging whether you are eligible for meal assistance programs, such as Meals on Wheels. Lifestyle  Do not smoke. Eat slowly. Take small bites and chew food well before swallowing. Do not overeat. This may make it more difficult to breathe after eating. Sit up while eating. If needed, continue to use supplemental oxygen while  eating. Rest or relax for 30 minutes before and after eating. Monitor your weight as told by your health care provider. Exercise as told by your health care provider. What foods should I eat? Fruits All fresh, dried, canned, or frozen fruits that do not cause gas. Vegetables All fresh, canned (no salt added), or frozen vegetables that do not cause gas. Grains Whole-grain bread. Enriched whole-grain pasta. Fortified whole-grain cereals. Fortified rice. Quinoa. Meats and other proteins Lean meat. Poultry. Fish. Dried beans. Unsalted nuts. Tofu. Eggs. Nut butters. Dairy Whole or 2% milk. Cheese. Yogurt. Fats and oils Olive oil. Canola oil. Butter. Margarine. Beverages Water. Vegetable juice (no salt added). Decaffeinated coffee. Decaffeinated or herbal tea. Seasonings and condiments Fresh or dried herbs. Low-salt or salt-free seasonings. Low-sodium soy sauce. The items listed above may not be a complete list of foods and beverages you can eat. Contact a dietitian for more information. What foods should I avoid? Fruits Fruits that cause gas, such as apples or melon. Vegetables Vegetables that cause gas, such as broccoli, Brussels sprouts, cabbage, cauliflower, and onions. Canned vegetables with added salt. Meats and other proteins Fried meat. Salt-cured meat. Processed meat. Dairy Fat-free or low-fat milk, yogurt, or cheese. Processed cheese. Beverages Carbonated drinks. Caffeinated drinks, such as coffee, tea, and soft drinks. Juice. Alcohol. Vegetable juice with added salt. Seasonings and condiments Salt. Seasoning mixes with salt. Soy sauce. Rosita Fire. Other foods Clear soup or broth. Fried foods. Prepared frozen meals. The items listed above may not be a complete list of foods and beverages you should avoid. Contact a dietitian for more information. Summary COPD symptoms can make it difficult to eat enough to maintain a healthy weight. A COPD eating plan can help you maintain  your body weight and keep your lungs as healthy as possible. Eat a diet that is high in calories, protein, and other nutrients. Read labels to make sure that you are getting the right nutrients. Cook foods to make them easier to chew and swallow. Eat 5-6 small meals throughout the day, and avoid foods that cause gas or make you feel bloated. This information is not intended to replace advice given to you by your health care provider. Make sure you discuss any questions you have with your health care provider. Document Revised: 05/23/2023 Document Reviewed: 05/23/2023 Elsevier Patient Education  2024 [employer].

## 2024-04-12 ENCOUNTER — Encounter: Payer: Self-pay | Admitting: Nurse Practitioner

## 2024-04-12 ENCOUNTER — Ambulatory Visit: Payer: Medicare PPO | Admitting: Nurse Practitioner

## 2024-04-12 VITALS — BP 120/77 | HR 83 | Temp 98.4°F | Resp 15 | Ht 65.0 in | Wt 141.4 lb

## 2024-04-12 DIAGNOSIS — E559 Vitamin D deficiency, unspecified: Secondary | ICD-10-CM

## 2024-04-12 DIAGNOSIS — Z0000 Encounter for general adult medical examination without abnormal findings: Secondary | ICD-10-CM | POA: Diagnosis not present

## 2024-04-12 DIAGNOSIS — F419 Anxiety disorder, unspecified: Secondary | ICD-10-CM

## 2024-04-12 DIAGNOSIS — F324 Major depressive disorder, single episode, in partial remission: Secondary | ICD-10-CM | POA: Diagnosis not present

## 2024-04-12 DIAGNOSIS — Z23 Encounter for immunization: Secondary | ICD-10-CM

## 2024-04-12 DIAGNOSIS — I700 Atherosclerosis of aorta: Secondary | ICD-10-CM

## 2024-04-12 DIAGNOSIS — R011 Cardiac murmur, unspecified: Secondary | ICD-10-CM

## 2024-04-12 DIAGNOSIS — F17210 Nicotine dependence, cigarettes, uncomplicated: Secondary | ICD-10-CM

## 2024-04-12 DIAGNOSIS — J432 Centrilobular emphysema: Secondary | ICD-10-CM | POA: Diagnosis not present

## 2024-04-12 DIAGNOSIS — I10 Essential (primary) hypertension: Secondary | ICD-10-CM | POA: Diagnosis not present

## 2024-04-12 DIAGNOSIS — E785 Hyperlipidemia, unspecified: Secondary | ICD-10-CM | POA: Diagnosis not present

## 2024-04-12 MED ORDER — ROSUVASTATIN CALCIUM 10 MG PO TABS: 10.0000 mg | ORAL_TABLET | Freq: Every day | ORAL | 2 refills | Status: DC

## 2024-04-12 NOTE — Assessment & Plan Note (Signed)
 Chronic,ongoing.  BP at goal.  Will continue Benazepril 20 MG daily, which she tolerated in past -- PCP would prefer ARB due to her COPD, but she tolerated ACE better in past.  Continue diet and exercise focus + maintaining weight loss.  Recommend she monitor BP at least a few mornings a week at home and document.  DASH diet at home.  Labs today: CBC and CMP.  Recommend cut back on smoking.

## 2024-04-12 NOTE — Assessment & Plan Note (Addendum)
 Chronic, noted on CT 03/12/23.  Educated patient on this.  Will continue Rosuvastatin  daily

## 2024-04-12 NOTE — Progress Notes (Signed)
 BP 120/77 (BP Location: Left Arm, Patient Position: Sitting, Cuff Size: Normal)   Pulse 83   Temp 98.4 F (36.9 C) (Oral)   Resp 15   Ht 5' 5 (1.651 m)   Wt 141 lb 6.4 oz (64.1 kg)   SpO2 97%   BMI 23.53 kg/m    Subjective:    Patient ID: Debra Munoz, female    DOB: 1956/04/01, 68 y.o.   MRN: [id number]  .[email or web address]  HPI: Debra Munoz is a 67 y.o. female presenting on 04/12/2024 for comprehensive medical examination. Pt reports doing well on medications, denies side effects. Pt reports improved mood and states she focuses on her hobbies. Pt denies any falls, illness or hospitalizations. Pt denies any other concerns today.   She currently lives with: self Menopausal Symptoms: no  HYPERTENSION / HYPERLIPIDEMIA Continues on Benazepril  and Rosuvastatin .  Satisfied with current treatment? yes Duration of hypertension: years BP monitoring frequency: rarely BP range:  BP medication side effects: no Duration of hyperlipidemia: years Cholesterol medication side effects: no Cholesterol supplements: none Medication compliance: excellent compliance Aspirin: no Recent stressors: no Recurrent headaches: no Visual changes: no Palpitations: no Dyspnea: no Chest pain: no Lower extremity edema: no Dizzy/lightheaded: no   COPD Lung screening on 03/12/23 noting moderate centrilobular emphysema and aortic atherosclerosis. Was to have follow-up to check on nodule but cancelled due to cost. Continues to smoke about 1 PPD.  COPD status: stable Satisfied with current treatment?: yes Oxygen use: no Dyspnea frequency:  Cough frequency:  Rescue inhaler frequency:   Limitation of activity: no Productive cough:  Last Spirometry:  Pneumovax: Up to Date Influenza: Up to Date   DEPRESSION Taking Trazodone  and Sertraline . Mood status: stable Satisfied with current treatment?: yes Symptom severity: mild  Duration of current treatment : years Side effects:  no Medication compliance: excellent compliance Psychotherapy/counseling: no  Depressed mood: no Anxious mood: no Anhedonia: no Significant weight loss or gain: no Insomnia: no  Fatigue: no Feelings of worthlessness or guilt: no Impaired concentration/indecisiveness: no Suicidal ideations: no Hopelessness: no Crying spells: no    04/12/2024    8:53 AM 04/06/2024   11:31 AM 09/26/2023    9:35 AM 05/20/2023    1:22 PM 05/05/2023    9:34 AM  Depression screen PHQ 2/9  Decreased Interest 0 0 1 0 0  Down, Depressed, Hopeless 0 0 0 0 0  PHQ - 2 Score 0 0 1 0 0  Altered sleeping 0 0 0 0 0  Tired, decreased energy 0 0 1 0 0  Change in appetite 0 0 1 1 0  Feeling bad or failure about yourself  0 0 0 0 0  Trouble concentrating 0 0 0 0 0  Moving slowly or fidgety/restless 0 0 0 2 0  Suicidal thoughts 0 0 0 0 0  PHQ-9 Score 0 0 3 3 0  Difficult doing work/chores  Not difficult at all Not difficult at all Somewhat difficult Not difficult at all       04/12/2024    8:53 AM 09/26/2023    9:35 AM 05/20/2023    1:22 PM 05/05/2023    9:35 AM  GAD 7 : Generalized Anxiety Score  Nervous, Anxious, on Edge 0 0 0 0  Control/stop worrying 0 0 0 0  Worry too much - different things 0 0 0 0  Trouble relaxing 0 0 1 1  Restless 0 0 0 0  Easily annoyed or irritable 0 0 0 0  Afraid - awful might happen 0 0 0 0  Total GAD 7 Score 0 0 1 1  Anxiety Difficulty   Somewhat difficult Not difficult at all        05/05/2023    9:34 AM 05/20/2023    1:22 PM 09/26/2023    9:35 AM 04/06/2024   11:34 AM 04/12/2024    8:53 AM  Fall Risk  Falls in the past year? 0 0 0 0 0  Was there an injury with Fall? 0 0 0 0 0  Fall Risk Category Calculator 0 0 0 0 0  Patient at Risk for Falls Due to No Fall Risks No Fall Risks No Fall Risks No Fall Risks No Fall Risks  Fall risk Follow up Falls evaluation completed Falls evaluation completed Falls evaluation completed Falls evaluation completed Falls evaluation completed     Past Medical History:  Past Medical History:  Diagnosis Date   Arthritis    Heart murmur     Surgical History:  Past Surgical History:  Procedure Laterality Date   FRACTURE SURGERY     TUBAL LIGATION      Medications:  Current Outpatient Medications on File Prior to Visit  Medication Sig   albuterol  (VENTOLIN  HFA) 108 (90 Base) MCG/ACT inhaler Inhale 2 puffs into the lungs every 6 (six) hours as needed for wheezing or shortness of breath.   benazepril  (LOTENSIN ) 20 MG tablet Take 1 tablet (20 mg total) by mouth daily.   diphenhydrAMINE HCl (BENADRYL ALLERGY PO) Take 1 tablet by mouth daily.   montelukast  (SINGULAIR ) 10 MG tablet TAKE 1 TABLET BY MOUTH EVERYDAY AT BEDTIME   rosuvastatin  (CRESTOR ) 10 MG tablet Take 1 tablet (10 mg total) by mouth daily.   sertraline  (ZOLOFT ) 100 MG tablet Take 1 tablet (100 mg total) by mouth daily.   traZODone  (DESYREL ) 50 MG tablet Take 2 tablets (100 mg total) by mouth at bedtime as needed.   triamcinolone  cream (KENALOG ) 0.1 % Apply 1 Application topically 2 (two) times daily.   No current facility-administered medications on file prior to visit.    Allergies:  No Known Allergies  Social History:  Social History   Socioeconomic History   Marital status: Married    Spouse name: [place]   Number of children: 2   Years of education: Not on file   Highest education level: Not on file  Occupational History   Occupation: retired  Tobacco Use   Smoking status: Every Day    Current packs/day: 0.75    Average packs/day: 0.8 packs/day for 50.7 years (38.0 ttl pk-yrs)    Types: Cigarettes    Start date: 1975    Passive exposure: Past   Smokeless tobacco: Never  Vaping Use   Vaping status: Never Used  Substance and Sexual Activity   Alcohol use: Not Currently   Drug use: Never   Sexual activity: Not Currently  Other Topics Concern   Not on file  Social History Narrative   Not on file   Social Drivers of Health   Financial  Resource Strain: Low Risk  (04/06/2024)   Overall Financial Resource Strain (CARDIA)    Difficulty of Paying Living Expenses: Not hard at all  Food Insecurity: No Food Insecurity (04/06/2024)   Hunger Vital Sign    Worried About Running Out of Food in the Last Year: Never true    Ran Out of Food in the Last Year: Never true  Transportation Needs: No Transportation Needs (04/06/2024)   PRAPARE -  Administrator, Civil Service (Medical): No    Lack of Transportation (Non-Medical): No  Physical Activity: Inactive (04/06/2024)   Exercise Vital Sign    Days of Exercise per Week: 0 days    Minutes of Exercise per Session: 0 min  Stress: No Stress Concern Present (04/06/2024)   [employer] of Occupational Health - Occupational Stress Questionnaire    Feeling of Stress: Not at all  Social Connections: Moderately Isolated (04/06/2024)   Social Connection and Isolation Panel    Frequency of Communication with Friends and Family: More than three times a week    Frequency of Social Gatherings with Friends and Family: Three times a week    Attends Religious Services: Never    Active Member of Clubs or Organizations: No    Attends Banker Meetings: Never    Marital Status: Married  Catering manager Violence: Not At Risk (04/06/2024)   Humiliation, Afraid, Rape, and Kick questionnaire    Fear of Current or Ex-Partner: No    Emotionally Abused: No    Physically Abused: No    Sexually Abused: No   Social History   Tobacco Use  Smoking Status Every Day   Current packs/day: 0.75   Average packs/day: 0.8 packs/day for 50.7 years (38.0 ttl pk-yrs)   Types: Cigarettes   Start date: 41   Passive exposure: Past  Smokeless Tobacco Never   Social History   Substance and Sexual Activity  Alcohol Use Not Currently    Family History:  Family History  Problem Relation Age of Onset   Heart disease Mother    Emphysema Mother    Diabetes Mother    Diabetes Sister    Diabetes  Sister    Aneurysm Maternal Grandfather        brain   Heart disease Paternal Grandfather     Past medical history, surgical history, medications, allergies, family history and social history reviewed with patient today and changes made to appropriate areas of the chart.   Review of Systems  Constitutional:  Negative for diaphoresis and fever.  Eyes:  Negative for blurred vision and double vision.  Respiratory:  Negative for cough, shortness of breath and wheezing.   Cardiovascular:  Negative for chest pain, palpitations and leg swelling.  Gastrointestinal:  Negative for abdominal pain, diarrhea and nausea.  Skin:  Negative for rash.  Neurological:  Negative for dizziness, weakness and headaches.  Psychiatric/Behavioral:  Negative for depression and suicidal ideas.    All other ROS negative except what is listed above and in the HPI.      Objective:    BP 120/77 (BP Location: Left Arm, Patient Position: Sitting, Cuff Size: Normal)   Pulse 83   Temp 98.4 F (36.9 C) (Oral)   Resp 15   Ht 5' 5 (1.651 m)   Wt 141 lb 6.4 oz (64.1 kg)   SpO2 97%   BMI 23.53 kg/m   Wt Readings from Last 3 Encounters:  04/12/24 141 lb 6.4 oz (64.1 kg)  04/06/24 135 lb (61.2 kg)  09/26/23 135 lb 12.8 oz (61.6 kg)    Physical Exam Vitals and nursing note reviewed. Exam conducted with a chaperone present.  Constitutional:      General: She is awake. She is not in acute distress.    Appearance: She is well-developed and well-groomed. She is not ill-appearing or toxic-appearing.  HENT:     Head: Normocephalic and atraumatic.     Right Ear: Hearing, tympanic membrane,  ear canal and external ear normal. No drainage.     Left Ear: Hearing, tympanic membrane, ear canal and external ear normal. No drainage.     Nose: Nose normal.     Right Sinus: No maxillary sinus tenderness or frontal sinus tenderness.     Left Sinus: No maxillary sinus tenderness or frontal sinus tenderness.     Mouth/Throat:      Mouth: Mucous membranes are moist.     Pharynx: Oropharynx is clear. Uvula midline. No pharyngeal swelling, oropharyngeal exudate or posterior oropharyngeal erythema.  Eyes:     General: Lids are normal.        Right eye: No discharge.        Left eye: No discharge.     Extraocular Movements: Extraocular movements intact.     Conjunctiva/sclera: Conjunctivae normal.     Pupils: Pupils are equal, round, and reactive to light.     Visual Fields: Right eye visual fields normal and left eye visual fields normal.  Neck:     Thyroid: No thyromegaly.     Vascular: No carotid bruit.     Trachea: Trachea normal.  Cardiovascular:     Rate and Rhythm: Normal rate and regular rhythm.     Heart sounds: Normal heart sounds. No murmur heard.    No gallop.  Pulmonary:     Effort: Pulmonary effort is normal. No accessory muscle usage or respiratory distress.     Breath sounds: Normal breath sounds.  Chest:  Breasts:    Right: Normal.     Left: Normal.  Abdominal:     General: Bowel sounds are normal.     Palpations: Abdomen is soft. There is no hepatomegaly or splenomegaly.     Tenderness: There is no abdominal tenderness.  Musculoskeletal:        General: Normal range of motion.     Cervical back: Normal range of motion and neck supple.     Right lower leg: No edema.     Left lower leg: No edema.  Lymphadenopathy:     Head:     Right side of head: No submental, submandibular, tonsillar, preauricular or posterior auricular adenopathy.     Left side of head: No submental, submandibular, tonsillar, preauricular or posterior auricular adenopathy.     Cervical: No cervical adenopathy.     Upper Body:     Right upper body: No supraclavicular, axillary or pectoral adenopathy.     Left upper body: No supraclavicular, axillary or pectoral adenopathy.  Skin:    General: Skin is warm and dry.     Capillary Refill: Capillary refill takes less than 2 seconds.     Findings: No rash.  Neurological:      Mental Status: She is alert and oriented to person, place, and time.     Gait: Gait is intact.     Deep Tendon Reflexes: Reflexes are normal and symmetric.     Reflex Scores:      Brachioradialis reflexes are 2+ on the right side and 2+ on the left side.      Patellar reflexes are 2+ on the right side and 2+ on the left side. Psychiatric:        Attention and Perception: Attention normal.        Mood and Affect: Mood normal.        Speech: Speech normal.        Behavior: Behavior normal. Behavior is cooperative.        Thought  Content: Thought content normal.        Judgment: Judgment normal.    Results for orders placed or performed in visit on 09/26/23  CBC with Differential/Platelet   Collection Time: 09/26/23  9:56 AM  Result Value Ref Range   WBC 5.5 3.4 - 10.8 x10E3/uL   RBC 4.47 3.77 - 5.28 x10E6/uL   Hemoglobin 13.6 11.1 - 15.9 g/dL   Hematocrit 58.3 65.9 - 46.6 %   MCV 93 79 - 97 fL   MCH 30.4 26.6 - 33.0 pg   MCHC 32.7 31.5 - 35.7 g/dL   RDW 87.7 88.2 - 84.5 %   Platelets 248 150 - 450 x10E3/uL   Neutrophils 68 Not Estab. %   Lymphs 20 Not Estab. %   Monocytes 7 Not Estab. %   Eos 3 Not Estab. %   Basos 1 Not Estab. %   Neutrophils Absolute 3.9 1.4 - 7.0 x10E3/uL   Lymphocytes Absolute 1.1 0.7 - 3.1 x10E3/uL   Monocytes Absolute 0.4 0.1 - 0.9 x10E3/uL   EOS (ABSOLUTE) 0.1 0.0 - 0.4 x10E3/uL   Basophils Absolute 0.0 0.0 - 0.2 x10E3/uL   Immature Granulocytes 1 Not Estab. %   Immature Grans (Abs) 0.0 0.0 - 0.1 x10E3/uL  Comprehensive metabolic panel   Collection Time: 09/26/23  9:56 AM  Result Value Ref Range   Glucose 79 70 - 99 mg/dL   BUN 10 8 - 27 mg/dL   Creatinine, Ser 9.12 0.57 - 1.00 mg/dL   eGFR 72 >40 fO/fpw/8.26   BUN/Creatinine Ratio 11 (L) 12 - 28   Sodium 138 134 - 144 mmol/L   Potassium 4.3 3.5 - 5.2 mmol/L   Chloride 101 96 - 106 mmol/L   CO2 22 20 - 29 mmol/L   Calcium  9.1 8.7 - 10.3 mg/dL   Total Protein 6.3 6.0 - 8.5 g/dL   Albumin  4.3 3.9 - 4.9 g/dL   Globulin, Total 2.0 1.5 - 4.5 g/dL   Bilirubin Total 0.4 0.0 - 1.2 mg/dL   Alkaline Phosphatase 81 44 - 121 IU/L   AST 19 0 - 40 IU/L   ALT 14 0 - 32 IU/L  Lipid Panel w/o Chol/HDL Ratio   Collection Time: 09/26/23  9:56 AM  Result Value Ref Range   Cholesterol, Total 142 100 - 199 mg/dL   Triglycerides 865 0 - 149 mg/dL   HDL 45 >60 mg/dL   VLDL Cholesterol Cal 24 5 - 40 mg/dL   LDL Chol Calc (NIH) 73 0 - 99 mg/dL      Assessment & Plan:   Problem List Items Addressed This Visit       Cardiovascular and Mediastinum   Hypertension   Chronic,ongoing.  BP at goal.  Will continue Benazepril  20 MG daily, which she tolerated in past -- PCP would prefer ARB due to her COPD, but she tolerated ACE better in past.  Continue diet and exercise focus + maintaining weight loss.  Recommend she monitor BP at least a few mornings a week at home and document.  DASH diet at home.  Labs today: CBC and CMP.  Recommend cut back on smoking.         Relevant Orders   CBC with Differential/Platelet   Comprehensive metabolic panel with GFR   TSH   Aortic atherosclerosis (HCC)   Chronic, noted on CT 03/12/23.  Educated patient on this.  Will continue Rosuvastatin  daily       Relevant Orders   Comprehensive metabolic  panel with GFR   Lipid Panel w/o Chol/HDL Ratio     Respiratory   Centrilobular emphysema (HCC) - Primary   Chronic, ongoing in long term smoker.  Continue Albuterol  as needed for now, but add on maintenance inhaler as needed in future. Spirometry overall reassuring with normal levels on 05/05/23, will repeat annually, unable to complete today due to equipment failure.  Recommend she use Albuterol  inhaler prior to working outside + continue Singulair .  Educated her on medication and BLACK BOX warning.  Repeat spirometry in October 2026.  Recommend she return for lung cancer screening when affordable.      Relevant Orders   CBC with Differential/Platelet     Other    Vitamin D  deficiency   Relevant Orders   VITAMIN D  25 Hydroxy (Vit-D Deficiency, Fractures)   Heart murmur   Systolic 2/6 noted left upper chest, no symptoms.  Consider imaging if worsening or symptoms present.      Relevant Orders   Comprehensive metabolic panel with GFR   Lipid Panel w/o Chol/HDL Ratio   Dyslipidemia   Chronic, ongoing.  Continue current medication regimen and adjust as needed.  Lipid panel today.      Relevant Orders   Comprehensive metabolic panel with GFR   Lipid Panel w/o Chol/HDL Ratio   Depression   Chronic, stable.  Denies SI/HI.  Continue Zoloft  and Trazodone , which offer benefit to overall mood.  Refills sent in.  Consider therapy if worsening mood.        Cigarette nicotine dependence without complication   I have recommended complete cessation of tobacco use. I have discussed various options available for assistance with tobacco cessation including over the counter methods (Nicotine gum, patch and lozenges). We also discussed prescription options (Chantix, Nicotine Inhaler / Nasal Spray). The patient is not interested in pursuing any prescription tobacco cessation options at this time. Recommend she return to lung cancer screening when affordable.      Anxiety   Refer to depression plan of care.      Other Visit Diagnoses       Flu vaccine need       Flu vaccine today, educated on this.   Relevant Orders   Flu vaccine HIGH DOSE PF(Fluzone Trivalent) (Completed)     Encounter for annual physical exam       Annual physical today with labs and health maintenance reviewed, discussed with patient.        Follow up plan: Return in about 6 months (around 10/10/2024) for HTN/HLD, COPD.   LABORATORY TESTING:  - Pap smear: not applicable  IMMUNIZATIONS:   - Tdap: Tetanus vaccination status reviewed: last tetanus booster within 10 years. - Influenza: Up to date - Pneumovax: Up to date - Prevnar: Up to date - COVID: Up to date - HPV: Not  applicable - Shingrix vaccine: Will get at pharmacy  SCREENING: -Mammogram: Ordered today  - Colonoscopy: Up to date  - Bone Density: Up to date  -Hearing Test: Up to date  -Spirometry: Up to date   PATIENT COUNSELING:   Advised to take 1 mg of folate supplement per day if capable of pregnancy.   Sexuality: Discussed sexually transmitted diseases, partner selection, use of condoms, avoidance of unintended pregnancy  and contraceptive alternatives.   Advised to avoid cigarette smoking.  I discussed with the patient that most people either abstain from alcohol or drink within safe limits (<=14/week and <=4 drinks/occasion for males, <=7/weeks and <= 3 drinks/occasion for females)  and that the risk for alcohol disorders and other health effects rises proportionally with the number of drinks per week and how often a drinker exceeds daily limits.  Discussed cessation/primary prevention of drug use and availability of treatment for abuse.   Diet: Encouraged to adjust caloric intake to maintain  or achieve ideal body weight, to reduce intake of dietary saturated fat and total fat, to limit sodium intake by avoiding high sodium foods and not adding table salt, and to maintain adequate dietary potassium and calcium  preferably from fresh fruits, vegetables, and low-fat dairy products.    Stressed the importance of regular exercise  Injury prevention: Discussed safety belts, safety helmets, smoke detector, smoking near bedding or upholstery.   Dental health: Discussed importance of regular tooth brushing, flossing, and dental visits.    NEXT PREVENTATIVE PHYSICAL DUE IN 1 YEAR. Return in about 6 months (around 10/10/2024) for HTN/HLD, COPD.

## 2024-04-12 NOTE — Assessment & Plan Note (Signed)
 I have recommended complete cessation of tobacco use. I have discussed various options available for assistance with tobacco cessation including over the counter methods (Nicotine gum, patch and lozenges). We also discussed prescription options (Chantix, Nicotine Inhaler / Nasal Spray). The patient is not interested in pursuing any prescription tobacco cessation options at this time. Recommend she return to lung cancer screening when affordable.

## 2024-04-12 NOTE — Assessment & Plan Note (Signed)
 Refer to depression plan of care.

## 2024-04-12 NOTE — Assessment & Plan Note (Signed)
 Chronic, ongoing.  Continue current medication regimen and adjust as needed. Lipid panel today.

## 2024-04-12 NOTE — Assessment & Plan Note (Signed)
Systolic 2/6 noted left upper chest, no symptoms.  Consider imaging if worsening or symptoms present. 

## 2024-04-12 NOTE — Assessment & Plan Note (Signed)
 Chronic, stable.  Denies SI/HI.  Continue Zoloft and Trazodone, which offer benefit to overall mood.  Refills sent in.  Consider therapy if worsening mood.

## 2024-04-12 NOTE — Assessment & Plan Note (Signed)
 Chronic, ongoing in long term smoker.  Continue Albuterol  as needed for now, but add on maintenance inhaler as needed in future. Spirometry overall reassuring with normal levels on 05/05/23, will repeat annually, unable to complete today due to equipment failure.  Recommend she use Albuterol  inhaler prior to working outside + continue Singulair .  Educated her on medication and BLACK BOX warning.  Repeat spirometry in October 2026.  Recommend she return for lung cancer screening when affordable.

## 2024-04-13 ENCOUNTER — Ambulatory Visit: Payer: Self-pay | Admitting: Nurse Practitioner

## 2024-04-13 LAB — COMPREHENSIVE METABOLIC PANEL WITH GFR
ALT: 14 IU/L (ref 0–32)
AST: 19 IU/L (ref 0–40)
Albumin: 4.4 g/dL (ref 3.9–4.9)
Alkaline Phosphatase: 85 IU/L (ref 49–135)
BUN/Creatinine Ratio: 12 (ref 12–28)
BUN: 11 mg/dL (ref 8–27)
Bilirubin Total: 0.3 mg/dL (ref 0.0–1.2)
CO2: 21 mmol/L (ref 20–29)
Calcium: 9.6 mg/dL (ref 8.7–10.3)
Chloride: 101 mmol/L (ref 96–106)
Creatinine, Ser: 0.91 mg/dL (ref 0.57–1.00)
Globulin, Total: 1.9 g/dL (ref 1.5–4.5)
Glucose: 87 mg/dL (ref 70–99)
Potassium: 4.2 mmol/L (ref 3.5–5.2)
Sodium: 137 mmol/L (ref 134–144)
Total Protein: 6.3 g/dL (ref 6.0–8.5)
eGFR: 68 mL/min/1.73 (ref >59)

## 2024-04-13 LAB — CBC WITH DIFFERENTIAL/PLATELET
Basophils Absolute: 0 x10E3/uL (ref 0.0–0.2)
Basos: 1 %
EOS (ABSOLUTE): 0.2 x10E3/uL (ref 0.0–0.4)
Eos: 4 %
Hematocrit: 41.2 % (ref 34.0–46.6)
Hemoglobin: 13.6 g/dL (ref 11.1–15.9)
Immature Grans (Abs): 0 x10E3/uL (ref 0.0–0.1)
Immature Granulocytes: 0 %
Lymphocytes Absolute: 1 x10E3/uL (ref 0.7–3.1)
Lymphs: 18 %
MCH: 31.1 pg (ref 26.6–33.0)
MCHC: 33 g/dL (ref 31.5–35.7)
MCV: 94 fL (ref 79–97)
Monocytes Absolute: 0.4 x10E3/uL (ref 0.1–0.9)
Monocytes: 7 %
Neutrophils Absolute: 4 x10E3/uL (ref 1.4–7.0)
Neutrophils: 70 %
Platelets: 248 x10E3/uL (ref 150–450)
RBC: 4.37 x10E6/uL (ref 3.77–5.28)
RDW: 12.6 % (ref 11.7–15.4)
WBC: 5.6 x10E3/uL (ref 3.4–10.8)

## 2024-04-13 LAB — VITAMIN D 25 HYDROXY (VIT D DEFICIENCY, FRACTURES): Vit D, 25-Hydroxy: 39.1 ng/mL (ref 30.0–100.0)

## 2024-04-13 LAB — LIPID PANEL W/O CHOL/HDL RATIO
Cholesterol, Total: 131 mg/dL (ref 100–199)
HDL: 39 mg/dL — ABNORMAL LOW (ref >39)
LDL Chol Calc (NIH): 64 mg/dL (ref 0–99)
Triglycerides: 163 mg/dL — ABNORMAL HIGH (ref 0–149)
VLDL Cholesterol Cal: 28 mg/dL (ref 5–40)

## 2024-04-13 LAB — TSH: TSH: 1.49 u[IU]/mL (ref 0.450–4.500)

## 2024-04-13 NOTE — Progress Notes (Signed)
 Contacted via MyChart  Good morning Char, your labs have returned and overall these look great.  Triglycerides are a little elevated, continue Rosuvastatin  and you may benefit increasing dose to 20 MG.  Would you like to try this? Let me know.  Any questions? Keep being stellar!!  Thank you for allowing me to participate in your care.  I appreciate you. Kindest regards, Chaya Dehaan

## 2024-04-19 MED ORDER — ROSUVASTATIN CALCIUM 20 MG PO TABS: 20.0000 mg | ORAL_TABLET | Freq: Every day | ORAL | 3 refills | Status: AC

## 2024-05-02 ENCOUNTER — Other Ambulatory Visit: Payer: Self-pay | Admitting: Nurse Practitioner

## 2024-05-04 NOTE — Telephone Encounter (Signed)
 Discontinued on 04/12/24.  Requested Prescriptions  Pending Prescriptions Disp Refills   rosuvastatin  (CRESTOR ) 10 MG tablet [Pharmacy Med Name: ROSUVASTATIN  CALCIUM  10 MG Oral Tablet] 90 tablet 3    Sig: TAKE 1 TABLET (10 MG TOTAL) BY MOUTH DAILY.     Cardiovascular:  Antilipid - Statins 2 Failed - 05/04/2024 12:07 PM      Failed - Lipid Panel in normal range within the last 12 months    Cholesterol, Total  Date Value Ref Range Status  04/12/2024 131 100 - 199 mg/dL Final   LDL Chol Calc (NIH)  Date Value Ref Range Status  04/12/2024 64 0 - 99 mg/dL Final   HDL  Date Value Ref Range Status  04/12/2024 39 (L) >39 mg/dL Final   Triglycerides  Date Value Ref Range Status  04/12/2024 163 (H) 0 - 149 mg/dL Final         Passed - Cr in normal range and within 360 days    Creatinine, Ser  Date Value Ref Range Status  04/12/2024 0.91 0.57 - 1.00 mg/dL Final         Passed - Patient is not pregnant      Passed - Valid encounter within last 12 months    Recent Outpatient Visits           3 weeks ago Centrilobular emphysema (HCC)   [place] [facility] Knier[place], Melanie T, NP   7 months ago Centrilobular emphysema (HCC)   [place] [facility] [place], Melanie DASEN, NP

## 2024-05-08 DIAGNOSIS — H43393 Other vitreous opacities, bilateral: Secondary | ICD-10-CM | POA: Diagnosis not present

## 2024-05-08 DIAGNOSIS — H2513 Age-related nuclear cataract, bilateral: Secondary | ICD-10-CM | POA: Diagnosis not present

## 2024-05-08 DIAGNOSIS — H5211 Myopia, right eye: Secondary | ICD-10-CM | POA: Diagnosis not present

## 2024-05-08 DIAGNOSIS — H359 Unspecified retinal disorder: Secondary | ICD-10-CM | POA: Diagnosis not present

## 2024-05-21 DIAGNOSIS — H353221 Exudative age-related macular degeneration, left eye, with active choroidal neovascularization: Secondary | ICD-10-CM | POA: Diagnosis not present

## 2024-06-18 DIAGNOSIS — H353221 Exudative age-related macular degeneration, left eye, with active choroidal neovascularization: Secondary | ICD-10-CM | POA: Diagnosis not present

## 2024-08-13 ENCOUNTER — Ambulatory Visit (INDEPENDENT_AMBULATORY_CARE_PROVIDER_SITE_OTHER): Admitting: Internal Medicine

## 2024-08-13 ENCOUNTER — Encounter: Payer: Self-pay | Admitting: Internal Medicine

## 2024-08-13 VITALS — BP 128/80 | HR 71 | Ht 65.0 in | Wt 145.2 lb

## 2024-08-13 DIAGNOSIS — R0982 Postnasal drip: Secondary | ICD-10-CM | POA: Diagnosis not present

## 2024-08-13 DIAGNOSIS — R058 Other specified cough: Secondary | ICD-10-CM

## 2024-08-13 DIAGNOSIS — J432 Centrilobular emphysema: Secondary | ICD-10-CM

## 2024-08-13 NOTE — Patient Instructions (Signed)

## 2024-08-13 NOTE — Progress Notes (Signed)
 "  Subjective:    Patient ID: Debra Munoz, female    DOB: 09-Jan-1956, 69 y.o.   MRN: [id number]  HPI  Discussed the use of AI scribe software for clinical note transcription with the patient, who gave verbal consent to proceed.   Debra Munoz is a 69 year old female with COPD who presents with a persistent cough.  She has been experiencing a cough for about two weeks. Initially, she had sinus pressure, which has improved, but she continues to have rhinorrhea described as a 'water drip.' The cough sometimes produces clear sputum, and she feels mucus is draining into her chest but is unable to expel it. No nasal congestion, ear pain, sore throat, shortness of breath, nausea, vomiting, diarrhea, fever, chills, or body aches.  Everyone in her household has been sick, and they have been passing the illness around. She has been using over-the-counter medications such as Mucinex and Advil Cold and Sinus, but feels that 'nothing is working.'  Her past medical history is significant for COPD. She is a smoker and currently uses Singulair  and albuterol  but is not on a daily maintenance inhaler. She denies the use of oxygen therapy. She has not experienced similar symptoms in the past year.      Past Medical History:  Diagnosis Date   Arthritis    Heart murmur     Current Outpatient Medications  Medication Sig Dispense Refill   albuterol  (VENTOLIN  HFA) 108 (90 Base) MCG/ACT inhaler Inhale 2 puffs into the lungs every 6 (six) hours as needed for wheezing or shortness of breath. 17 each 2   benazepril  (LOTENSIN ) 20 MG tablet Take 1 tablet (20 mg total) by mouth daily. 90 tablet 4   diphenhydrAMINE HCl (BENADRYL ALLERGY PO) Take 1 tablet by mouth daily.     montelukast  (SINGULAIR ) 10 MG tablet TAKE 1 TABLET BY MOUTH EVERYDAY AT BEDTIME 90 tablet 2   rosuvastatin  (CRESTOR ) 20 MG tablet Take 1 tablet (20 mg total) by mouth daily. 90 tablet 3   sertraline  (ZOLOFT ) 100 MG tablet  Take 1 tablet (100 mg total) by mouth daily. 90 tablet 4   traZODone  (DESYREL ) 50 MG tablet Take 2 tablets (100 mg total) by mouth at bedtime as needed. 180 tablet 4   triamcinolone  cream (KENALOG ) 0.1 % Apply 1 Application topically 2 (two) times daily. 45 g 1   No current facility-administered medications for this visit.    Allergies[1]  Family History  Problem Relation Age of Onset   Heart disease Mother    Emphysema Mother    Diabetes Mother    Diabetes Sister    Diabetes Sister    Aneurysm Maternal Grandfather        brain   Heart disease Paternal Grandfather     Social History   Socioeconomic History   Marital status: Married    Spouse name: [place]   Number of children: 2   Years of education: Not on file   Highest education level: Not on file  Occupational History   Occupation: retired  Tobacco Use   Smoking status: Every Day    Current packs/day: 0.75    Average packs/day: 0.8 packs/day for 51.0 years (38.3 ttl pk-yrs)    Types: Cigarettes    Start date: 1975    Passive exposure: Past   Smokeless tobacco: Never  Vaping Use   Vaping status: Never Used  Substance and Sexual Activity   Alcohol use: Not Currently   Drug use: Never  Sexual activity: Not Currently  Other Topics Concern   Not on file  Social History Narrative   Not on file   Social Drivers of Health   Tobacco Use: High Risk (04/12/2024)   Patient History    Smoking Tobacco Use: Every Day    Smokeless Tobacco Use: Never    Passive Exposure: Past  Financial Resource Strain: Low Risk (04/06/2024)   Overall Financial Resource Strain (CARDIA)    Difficulty of Paying Living Expenses: Not hard at all  Food Insecurity: No Food Insecurity (04/06/2024)   Epic    Worried About Programme Researcher, Broadcasting/film/video in the Last Year: Never true    Ran Out of Food in the Last Year: Never true  Transportation Needs: No Transportation Needs (04/06/2024)   Epic    Lack of Transportation (Medical): No    Lack of  Transportation (Non-Medical): No  Physical Activity: Inactive (04/06/2024)   Exercise Vital Sign    Days of Exercise per Week: 0 days    Minutes of Exercise per Session: 0 min  Stress: No Stress Concern Present (04/06/2024)   [employer] of Occupational Health - Occupational Stress Questionnaire    Feeling of Stress: Not at all  Social Connections: Moderately Isolated (04/06/2024)   Social Connection and Isolation Panel    Frequency of Communication with Friends and Family: More than three times a week    Frequency of Social Gatherings with Friends and Family: Three times a week    Attends Religious Services: Never    Active Member of Clubs or Organizations: No    Attends Banker Meetings: Never    Marital Status: Married  Catering Manager Violence: Not At Risk (04/06/2024)   Epic    Fear of Current or Ex-Partner: No    Emotionally Abused: No    Physically Abused: No    Sexually Abused: No  Depression (PHQ2-9): Low Risk (04/12/2024)   Depression (PHQ2-9)    PHQ-2 Score: 0  Alcohol Screen: Low Risk (04/06/2024)   Alcohol Screen    Last Alcohol Screening Score (AUDIT): 0  Housing: Low Risk (04/06/2024)   Epic    Unable to Pay for Housing in the Last Year: No    Number of Times Moved in the Last Year: 0    Homeless in the Last Year: No  Utilities: Not At Risk (04/06/2024)   Epic    Threatened with loss of utilities: No  Health Literacy: Adequate Health Literacy (04/06/2024)   B1300 Health Literacy    Frequency of need for help with medical instructions: Never     Constitutional: Denies fever, malaise, fatigue, headache or abrupt weight changes.  HEENT: Pt reports reports sinus pressure, runny nose. Denies eye pain, eye redness, ear pain, ringing in the ears, wax buildup, nasal congestion, bloody nose, or sore throat. Respiratory: Pt reports cough. Denies difficulty breathing, shortness of breath.   Cardiovascular: Denies chest pain, chest tightness, palpitations or  swelling in the hands or feet.  Gastrointestinal: Denies abdominal pain, bloating, constipation, diarrhea or blood in the stool.  Musculoskeletal: Denies decrease in range of motion, difficulty with gait, muscle pain or joint pain and swelling.  Neurological: Denies dizziness, difficulty with memory, difficulty with speech or problems with balance and coordination.    No other specific complaints in a complete review of systems (except as listed in HPI above).  Objective   BP 128/80 (BP Location: Left Arm, Patient Position: Sitting, Cuff Size: Normal)   Pulse 71   Ht  5' 5 (1.651 m)   Wt 145 lb 3.2 oz (65.9 kg)   SpO2 98%   BMI 24.16 kg/m   Wt Readings from Last 3 Encounters:  04/12/24 141 lb 6.4 oz (64.1 kg)  04/06/24 135 lb (61.2 kg)  09/26/23 135 lb 12.8 oz (61.6 kg)    General: Appears her stated age, well developed, well nourished in NAD. Skin: Warm, dry and intact.  HEENT: Head: normal shape and size, no sinus tenderness noted; Eyes: sclera white, no icterus, conjunctiva pink, PERRLA and EOMs intact; Throat/Mouth: Teeth present, mucosa pink and moist, + PND, no exudate, lesions or ulcerations noted.  Neck: No adenopathy noted.  Cardiovascular: Normal rate and rhythm. S1,S2 noted.  No murmur, rubs or gallops noted.  Pulmonary/Chest: Normal effort and positive vesicular breath sounds. No respiratory distress. No wheezes, rales or ronchi noted.  Musculoskeletal: No difficulty with gait.  Neurological: Alert and oriented.  BMET    Component Value Date/Time   NA 137 04/12/2024 0923   K 4.2 04/12/2024 0923   CL 101 04/12/2024 0923   CO2 21 04/12/2024 0923   GLUCOSE 87 04/12/2024 0923   BUN 11 04/12/2024 0923   CREATININE 0.91 04/12/2024 0923   CALCIUM  9.6 04/12/2024 0923    Lipid Panel     Component Value Date/Time   CHOL 131 04/12/2024 0923   TRIG 163 (H) 04/12/2024 0923   HDL 39 (L) 04/12/2024 0923   LDLCALC 64 04/12/2024 0923    CBC    Component Value  Date/Time   WBC 5.6 04/12/2024 0923   RBC 4.37 04/12/2024 0923   HGB 13.6 04/12/2024 0923   HCT 41.2 04/12/2024 0923   PLT 248 04/12/2024 0923   MCV 94 04/12/2024 0923   MCH 31.1 04/12/2024 0923   MCHC 33.0 04/12/2024 0923   RDW 12.6 04/12/2024 0923   LYMPHSABS 1.0 04/12/2024 0923   EOSABS 0.2 04/12/2024 0923   BASOSABS 0.0 04/12/2024 0923    Hgb A1C No results found for: HGBA1C     Assessment and Plan    Assessment and Plan    Post viral cough syndrome, post nasal drip Cough likely post-viral with postnasal drip. No fever or wheezing. Lungs clear. - Recommended Zyrtec or Claritin twice daily for one week, then once daily thereafter x 1 week. - Offered cough suppressants however she declined at this time.  Chronic obstructive pulmonary disease (COPD) Mild COPD, no wheezing or shortness of breath. Lungs clear. No maintenance inhaler used. - Continue Singulair  and albuterol . - Consider daily maintenance inhaler with steroid if cough persists beyond four weeks.  -Encouraged smoking cessation     Follow-up with your PCP as previously scheduled Angeline Laura, NP     [1] No Known Allergies  "

## 2024-08-20 ENCOUNTER — Encounter: Payer: Self-pay | Admitting: Internal Medicine

## 2024-08-20 NOTE — Telephone Encounter (Signed)
 Yes, she needs to follow-up with her PCP for reevaluation

## 2024-09-01 ENCOUNTER — Other Ambulatory Visit: Payer: Self-pay | Admitting: Nurse Practitioner

## 2024-09-02 NOTE — Telephone Encounter (Signed)
 Requested by interface surescripts. Future visit 10/11/24.  Requested Prescriptions  Pending Prescriptions Disp Refills   montelukast  (SINGULAIR ) 10 MG tablet [Pharmacy Med Name: MONTELUKAST  SODIUM 10 MG Oral Tablet] 90 tablet 0    Sig: TAKE 1 TABLET (10 MG TOTAL) BY MOUTH AT BEDTIME.     Pulmonology:  Leukotriene Inhibitors Passed - 09/02/2024  2:14 PM      Passed - Valid encounter within last 12 months    Recent Outpatient Visits           2 weeks ago Post-viral cough syndrome   [place] [facility] [place], Angeline ORN, NP   4 months ago Centrilobular emphysema [facility])   [place] [facility] [place], Melanie T, NP   11 months ago Centrilobular emphysema [facility])   [place] [facility] [place], Melanie DASEN, NP

## 2024-10-11 ENCOUNTER — Ambulatory Visit: Admitting: Nurse Practitioner

## 2025-04-12 ENCOUNTER — Ambulatory Visit
